# Patient Record
Sex: Male | Born: 1960 | Race: White | Hispanic: No | Marital: Single | Smoking: Former smoker
Health system: Southern US, Community
[De-identification: ages and names within clinical notes are randomized; demographics above are authoritative.]

## PROBLEM LIST (undated history)

## (undated) DIAGNOSIS — F329 Major depressive disorder, single episode, unspecified: Secondary | ICD-10-CM

## (undated) DIAGNOSIS — F419 Anxiety disorder, unspecified: Secondary | ICD-10-CM

## (undated) DIAGNOSIS — E039 Hypothyroidism, unspecified: Secondary | ICD-10-CM

## (undated) DIAGNOSIS — G5601 Carpal tunnel syndrome, right upper limb: Secondary | ICD-10-CM

## (undated) DIAGNOSIS — F32A Depression, unspecified: Secondary | ICD-10-CM

## (undated) HISTORY — DX: Anxiety disorder, unspecified: F41.9

## (undated) HISTORY — DX: Depression, unspecified: F32.A

## (undated) HISTORY — DX: Major depressive disorder, single episode, unspecified: F32.9

---

## 2012-12-03 ENCOUNTER — Other Ambulatory Visit: Payer: Self-pay | Admitting: Orthopedic Surgery

## 2012-12-10 ENCOUNTER — Encounter (HOSPITAL_COMMUNITY): Payer: Self-pay | Admitting: Pharmacy Technician

## 2012-12-12 ENCOUNTER — Other Ambulatory Visit (HOSPITAL_COMMUNITY): Payer: Self-pay | Admitting: Orthopedic Surgery

## 2012-12-12 NOTE — Patient Instructions (Addendum)
20 Savino Whisenant  12/12/2012   Your procedure is scheduled on: 12-19-2012  Report to [facility] at 0630 AM.  Call this number if you have problems the morning of surgery [phone]   Remember:   Do not eat food or drink liquids :After Midnight.     Take these medicines the morning of surgery with A SIP OF WATER: no medications   Do not wear jewelry, make-up or nail polish.  Do not wear lotions, powders, or perfumes. You may wear deodorant.   Men may shave face and neck.  Do not bring valuables to the hospital.  Contacts, dentures or bridgework may not be worn into surgery.  Leave suitcase in the car. After surgery it may be brought to your room.  For patients admitted to the hospital, checkout time is 11:00 AM the day of discharge.      Please read over the following fact sheets that you were given: MRSA Information.  Call  Birdie Sons RN pre op nurse if needed [phone]    FAILURE TO FOLLOW THESE INSTRUCTIONS MAY RESULT IN THE CANCELLATION OF YOUR SURGERY. PATIENT SIGNATURE___________________________________________

## 2012-12-13 ENCOUNTER — Encounter (HOSPITAL_COMMUNITY)
Admission: RE | Admit: 2012-12-13 | Discharge: 2012-12-13 | Disposition: A | Discharge status: Home / Self Care | Payer: Managed Care, Other (non HMO) | Source: Ambulatory Visit | Attending: Orthopedic Surgery | Admitting: Orthopedic Surgery

## 2012-12-13 ENCOUNTER — Encounter (HOSPITAL_COMMUNITY): Payer: Self-pay

## 2012-12-13 HISTORY — DX: Hypothyroidism, unspecified: E03.9

## 2012-12-13 LAB — CBC
HCT: 43.1 % (ref 39.0–52.0)
MCH: 31.7 pg (ref 26.0–34.0)
MCHC: 35 g/dL (ref 30.0–36.0)
MCV: 90.5 fL (ref 78.0–100.0)
Platelets: 159 10*3/uL (ref 150–400)
RDW: 12.4 % (ref 11.5–15.5)

## 2012-12-18 NOTE — Anesthesia Preprocedure Evaluation (Addendum)
Anesthesia Evaluation  Patient identified by MRN, date of birth, ID band Patient awake    Reviewed: Allergy & Precautions, H&P , NPO status , Patient's Chart, lab work & pertinent test results  Airway Mallampati: II TM Distance: >3 FB Neck ROM: Full    Dental  (+) Teeth Intact and Dental Advisory Given   Pulmonary neg pulmonary ROS,  breath sounds clear to auscultation  Pulmonary exam normal       Cardiovascular negative cardio ROS  Rhythm:Regular Rate:Normal     Neuro/Psych negative neurological ROS  negative psych ROS   GI/Hepatic negative GI ROS, Neg liver ROS,   Endo/Other  Hypothyroidism Morbid obesity  Renal/GU negative Renal ROS  negative genitourinary   Musculoskeletal negative musculoskeletal ROS (+)   Abdominal   Peds  Hematology negative hematology ROS (+)   Anesthesia Other Findings   Reproductive/Obstetrics negative OB ROS                          Anesthesia Physical Anesthesia Plan  ASA: II  Anesthesia Plan: General   Post-op Pain Management: MAC Combined w/ Regional for Post-op pain   Induction: Intravenous  Airway Management Planned: Oral ETT  Additional Equipment:   Intra-op Plan:   Post-operative Plan: Extubation in OR  Informed Consent: I have reviewed the patients History and Physical, chart, labs and discussed the procedure including the risks, benefits and alternatives for the proposed anesthesia with the patient or authorized representative who has indicated his/her understanding and acceptance.   Dental advisory given  Plan Discussed with: CRNA  Anesthesia Plan Comments:         Anesthesia Quick Evaluation

## 2012-12-19 ENCOUNTER — Ambulatory Visit (HOSPITAL_COMMUNITY): Payer: Managed Care, Other (non HMO) | Admitting: Anesthesiology

## 2012-12-19 ENCOUNTER — Encounter (HOSPITAL_COMMUNITY): Payer: Self-pay | Admitting: *Deleted

## 2012-12-19 ENCOUNTER — Ambulatory Visit (HOSPITAL_COMMUNITY)
Admission: RE | Admit: 2012-12-19 | Discharge: 2012-12-19 | Disposition: A | Discharge status: Home / Self Care | Payer: Managed Care, Other (non HMO) | Source: Ambulatory Visit | Attending: Orthopedic Surgery | Admitting: Orthopedic Surgery

## 2012-12-19 ENCOUNTER — Encounter (HOSPITAL_COMMUNITY)
Admission: RE | Disposition: A | Discharge status: Home / Self Care | Payer: Self-pay | Source: Ambulatory Visit | Attending: Orthopedic Surgery

## 2012-12-19 ENCOUNTER — Encounter (HOSPITAL_COMMUNITY): Payer: Self-pay | Admitting: Anesthesiology

## 2012-12-19 DIAGNOSIS — E039 Hypothyroidism, unspecified: Secondary | ICD-10-CM | POA: Insufficient documentation

## 2012-12-19 DIAGNOSIS — M25519 Pain in unspecified shoulder: Secondary | ICD-10-CM

## 2012-12-19 DIAGNOSIS — Z79899 Other long term (current) drug therapy: Secondary | ICD-10-CM | POA: Insufficient documentation

## 2012-12-19 DIAGNOSIS — M75100 Unspecified rotator cuff tear or rupture of unspecified shoulder, not specified as traumatic: Secondary | ICD-10-CM

## 2012-12-19 DIAGNOSIS — M24119 Other articular cartilage disorders, unspecified shoulder: Secondary | ICD-10-CM | POA: Insufficient documentation

## 2012-12-19 DIAGNOSIS — M719 Bursopathy, unspecified: Secondary | ICD-10-CM | POA: Insufficient documentation

## 2012-12-19 DIAGNOSIS — M67919 Unspecified disorder of synovium and tendon, unspecified shoulder: Secondary | ICD-10-CM | POA: Insufficient documentation

## 2012-12-19 DIAGNOSIS — M19019 Primary osteoarthritis, unspecified shoulder: Secondary | ICD-10-CM | POA: Insufficient documentation

## 2012-12-19 DIAGNOSIS — Z01812 Encounter for preprocedural laboratory examination: Secondary | ICD-10-CM | POA: Insufficient documentation

## 2012-12-19 HISTORY — PX: SHOULDER ARTHROSCOPY WITH OPEN ROTATOR CUFF REPAIR AND DISTAL CLAVICLE ACROMINECTOMY: SHX5683

## 2012-12-19 SURGERY — SHOULDER ARTHROSCOPY WITH OPEN ROTATOR CUFF REPAIR AND DISTAL CLAVICLE ACROMINECTOMY
Anesthesia: General | Site: Shoulder | Laterality: Left

## 2012-12-19 MED ORDER — FENTANYL CITRATE 0.05 MG/ML IJ SOLN
INTRAMUSCULAR | Status: DC | PRN
  Administered 2012-12-19 (×4): 50 ug via INTRAVENOUS

## 2012-12-19 MED ORDER — SUCCINYLCHOLINE CHLORIDE 20 MG/ML IJ SOLN
INTRAMUSCULAR | Status: DC | PRN
  Administered 2012-12-19: 120 mg via INTRAVENOUS

## 2012-12-19 MED ORDER — HYDROMORPHONE HCL PF 1 MG/ML IJ SOLN: 0.2500 mg | INTRAMUSCULAR | Status: DC | PRN

## 2012-12-19 MED ORDER — ONDANSETRON HCL 4 MG/2ML IJ SOLN
INTRAMUSCULAR | Status: DC | PRN
  Administered 2012-12-19 (×2): 2 mg via INTRAVENOUS

## 2012-12-19 MED ORDER — BUPIVACAINE-EPINEPHRINE 0.5% -1:200000 IJ SOLN
INTRAMUSCULAR | Status: DC | PRN
  Administered 2012-12-19: 13 mL

## 2012-12-19 MED ORDER — EPINEPHRINE HCL 1 MG/ML IJ SOLN
INTRAMUSCULAR | Status: DC | PRN
  Administered 2012-12-19 (×2): 1 mg

## 2012-12-19 MED ORDER — LACTATED RINGERS IR SOLN
Status: DC | PRN
  Administered 2012-12-19 (×2): 3000 mL

## 2012-12-19 MED ORDER — MIDAZOLAM HCL 5 MG/5ML IJ SOLN
INTRAMUSCULAR | Status: DC | PRN
  Administered 2012-12-19 (×2): 1 mg via INTRAVENOUS

## 2012-12-19 MED ORDER — ACETAMINOPHEN 10 MG/ML IV SOLN
INTRAVENOUS | Status: DC | PRN
  Administered 2012-12-19: 1000 mg via INTRAVENOUS

## 2012-12-19 MED ORDER — OXYCODONE-ACETAMINOPHEN 7.5-325 MG PO TABS: 1.0000 | ORAL_TABLET | ORAL | Status: DC | PRN

## 2012-12-19 MED ORDER — ROPIVACAINE HCL 5 MG/ML IJ SOLN
INTRAMUSCULAR | Status: DC | PRN
  Administered 2012-12-19: 30 mL via EPIDURAL

## 2012-12-19 MED ORDER — POVIDONE-IODINE 7.5 % EX SOLN: Freq: Once | CUTANEOUS | Status: DC

## 2012-12-19 MED ORDER — CISATRACURIUM BESYLATE (PF) 10 MG/5ML IV SOLN
INTRAVENOUS | Status: DC | PRN
  Administered 2012-12-19: 6 mg via INTRAVENOUS

## 2012-12-19 MED ORDER — LIDOCAINE HCL (CARDIAC) 20 MG/ML IV SOLN
INTRAVENOUS | Status: DC | PRN
  Administered 2012-12-19: 75 mg via INTRAVENOUS

## 2012-12-19 MED ORDER — LIDOCAINE HCL (CARDIAC) 20 MG/ML IV SOLN: INTRAVENOUS | Status: DC | PRN

## 2012-12-19 MED ORDER — SUCCINYLCHOLINE CHLORIDE 20 MG/ML IJ SOLN: INTRAMUSCULAR | Status: DC | PRN

## 2012-12-19 MED ORDER — PROMETHAZINE HCL 25 MG/ML IJ SOLN: 6.2500 mg | INTRAMUSCULAR | Status: DC | PRN

## 2012-12-19 MED ORDER — LACTATED RINGERS IV SOLN
INTRAVENOUS | Status: DC | PRN
  Administered 2012-12-19 (×2): via INTRAVENOUS

## 2012-12-19 MED ORDER — DEXAMETHASONE SODIUM PHOSPHATE 10 MG/ML IJ SOLN
INTRAMUSCULAR | Status: DC | PRN
  Administered 2012-12-19: 10 mg via INTRAVENOUS

## 2012-12-19 MED ORDER — CEFAZOLIN SODIUM-DEXTROSE 2-3 GM-% IV SOLR
2.0000 g | INTRAVENOUS | Status: AC
  Administered 2012-12-19: 2 g via INTRAVENOUS

## 2012-12-19 MED ORDER — PROPOFOL 10 MG/ML IV EMUL
INTRAVENOUS | Status: DC | PRN
  Administered 2012-12-19: 180 mg via INTRAVENOUS

## 2012-12-19 MED ORDER — LACTATED RINGERS IV SOLN
INTRAVENOUS | Status: DC
Start: 2012-12-19 — End: 2012-12-19

## 2012-12-19 MED ORDER — EPHEDRINE SULFATE 50 MG/ML IJ SOLN
INTRAMUSCULAR | Status: DC | PRN
  Administered 2012-12-19: 2.5 mg via INTRAVENOUS
  Administered 2012-12-19: 5 mg via INTRAVENOUS
  Administered 2012-12-19 (×2): 2.5 mg via INTRAVENOUS

## 2012-12-19 MED ORDER — METHOCARBAMOL 500 MG PO TABS: 500.0000 mg | ORAL_TABLET | Freq: Four times a day (QID) | ORAL | Status: DC

## 2012-12-19 SURGICAL SUPPLY — 50 items
ANCHOR PEEK ZIP 5.5 NDL NO2 (Orthopedic Implant) ×2 IMPLANT
BENZOIN TINCTURE PRP APPL 2/3 (GAUZE/BANDAGES/DRESSINGS) IMPLANT
BLADE 4.2CUDA (BLADE) ×2 IMPLANT
BLADE SURG SZ11 CARB STEEL (BLADE) ×2 IMPLANT
BOOTIES KNEE HIGH SLOAN (MISCELLANEOUS) IMPLANT
BUR OVAL 4.0 (BURR) ×2 IMPLANT
CLOTH BEACON ORANGE TIMEOUT ST (SAFETY) ×2 IMPLANT
COVER SURGICAL LIGHT HANDLE (MISCELLANEOUS) ×2 IMPLANT
DRAPE LG THREE QUARTER DISP (DRAPES) ×2 IMPLANT
DRAPE SHOULDER BEACH CHAIR (DRAPES) ×2 IMPLANT
DRAPE U-SHAPE 47X51 STRL (DRAPES) ×2 IMPLANT
DRSG ADAPTIC 3X8 NADH LF (GAUZE/BANDAGES/DRESSINGS) ×2 IMPLANT
DRSG PAD ABDOMINAL 8X10 ST (GAUZE/BANDAGES/DRESSINGS) ×4 IMPLANT
DURAPREP 26ML APPLICATOR (WOUND CARE) ×2 IMPLANT
ELECT KIT MENISCUS BASIC 165 (SET/KITS/TRAYS/PACK) IMPLANT
ELECT REM PT RETURN 9FT ADLT (ELECTROSURGICAL) ×2
ELECTRODE REM PT RTRN 9FT ADLT (ELECTROSURGICAL) ×1 IMPLANT
GLOVE BIOGEL PI IND STRL 7.0 (GLOVE) ×1 IMPLANT
GLOVE BIOGEL PI IND STRL 8 (GLOVE) ×1 IMPLANT
GLOVE BIOGEL PI INDICATOR 7.0 (GLOVE) ×1
GLOVE BIOGEL PI INDICATOR 8 (GLOVE) ×1
GLOVE ECLIPSE 8.0 STRL XLNG CF (GLOVE) ×2 IMPLANT
GLOVE INDICATOR 8.0 STRL GRN (GLOVE) ×4 IMPLANT
GOWN STRL REIN XL XLG (GOWN DISPOSABLE) ×6 IMPLANT
MANIFOLD NEPTUNE II (INSTRUMENTS) ×2 IMPLANT
NDL SAFETY ECLIPSE 18X1.5 (NEEDLE) ×1 IMPLANT
NEEDLE HYPO 18GX1.5 SHARP (NEEDLE) ×1
NEEDLE MA TROC 1/2 (NEEDLE) IMPLANT
NEEDLE MA TROC 1/2 CIR (NEEDLE) IMPLANT
NEEDLE SPNL 18GX3.5 QUINCKE PK (NEEDLE) ×2 IMPLANT
PACK SHOULDER CUSTOM OPM052 (CUSTOM PROCEDURE TRAY) ×2 IMPLANT
POSITIONER SURGICAL ARM (MISCELLANEOUS) IMPLANT
SET ARTHROSCOPY TUBING (MISCELLANEOUS) ×1
SET ARTHROSCOPY TUBING LN (MISCELLANEOUS) ×1 IMPLANT
SLING ARM IMMOBILIZER LRG (SOFTGOODS) ×2 IMPLANT
SLING ARM IMMOBILIZER MED (SOFTGOODS) IMPLANT
SPONGE GAUZE 4X4 12PLY (GAUZE/BANDAGES/DRESSINGS) ×2 IMPLANT
STAPLER VISISTAT (STAPLE) ×2 IMPLANT
STRIP CLOSURE SKIN 1/2X4 (GAUZE/BANDAGES/DRESSINGS) IMPLANT
SUCTION FRAZIER TIP 10 FR DISP (SUCTIONS) ×2 IMPLANT
SUT BONE WAX W31G (SUTURE) ×2 IMPLANT
SUT ETHIBOND NAB CT1 #1 30IN (SUTURE) IMPLANT
SUT ETHILON 4 0 PS 2 18 (SUTURE) ×2 IMPLANT
SUT VIC AB 1 CT1 27 (SUTURE) ×3
SUT VIC AB 1 CT1 27XBRD ANTBC (SUTURE) ×3 IMPLANT
SUT VIC AB 2-0 CT1 27 (SUTURE)
SUT VIC AB 2-0 CT1 27XBRD (SUTURE) IMPLANT
SYR 3ML LL SCALE MARK (SYRINGE) IMPLANT
TUBING CONNECTING 10 (TUBING) ×2 IMPLANT
WAND 90 DEG TURBOVAC W/CORD (SURGICAL WAND) ×2 IMPLANT

## 2012-12-19 NOTE — Transfer of Care (Signed)
Immediate Anesthesia Transfer of Care Note  Patient: Marc Sanders  Procedure(s) Performed: Procedure(s) (LRB) with comments: SHOULDER ARTHROSCOPY WITH OPEN ROTATOR CUFF REPAIR AND DISTAL CLAVICLE ACROMINECTOMY (Left) - LEFT SHOULDER ARTHROSCOPY WITH LABRAL DEBRIDEMENT AND SUBACROMIAL DECOMPRESSION DISTAL CLAVICLE RESECTION AND MINI OPEN ROTATOR CUFF REPAIR   Patient Location: PACU  Anesthesia Type:GA combined with regional for post-op pain  Level of Consciousness: awake, alert , oriented and patient cooperative  Airway & Oxygen Therapy: Patient Spontanous Breathing and Patient connected to face mask oxygen  Post-op Assessment: Report given to PACU RN, Post -op Vital signs reviewed and stable and Patient moving all extremities  Post vital signs: Reviewed and stable  Complications: No apparent anesthesia complications

## 2012-12-19 NOTE — H&P (Signed)
Marc Sanders is an 52 y.o. male.   Chief Complaint: painful lt shoulder HPI: MRI demonstrates labral tear, ac arthritis, and small full thickness rotator cuff tear  Past Medical History  Diagnosis Date  . Hypothyroidism     Past Surgical History  Procedure Date  . No past surgeries     History reviewed. No pertinent family history. Social History:  reports that he quit smoking about 26 years ago. His smoking use included Cigarettes. He has a 8 pack-year smoking history. He has never used smokeless tobacco. He reports that he drinks alcohol. He reports that he does not use illicit drugs.  Allergies: No Known Allergies  Medications Prior to Admission  Medication Sig Dispense Refill  . ibuprofen (ADVIL,MOTRIN) 200 MG tablet Take 400 mg by mouth 2 (two) times daily.      Marland Kitchen thyroid (ARMOUR) 15 MG tablet Take 15-30 mg by mouth 2 (two) times daily. 30mg  in the morning and 15mg  at bedtime.      . traMADol (ULTRAM) 50 MG tablet Take 50 mg by mouth every 6 (six) hours as needed.      . zolpidem (AMBIEN) 10 MG tablet Take 10 mg by mouth at bedtime.        No results found for this or any previous visit (from the past 48 hour(s)). No results found.  ROS  Blood pressure 144/92, pulse 82, temperature 97.1 F (36.2 C), temperature source Oral, resp. rate 18, SpO2 98.00%. Physical Exam  Constitutional: He is oriented to person, place, and time. He appears well-developed and well-nourished.  HENT:  Head: Normocephalic and atraumatic.  Right Ear: External ear normal.  Left Ear: External ear normal.  Nose: Nose normal.  Mouth/Throat: Oropharynx is clear and moist.  Eyes: Conjunctivae normal and EOM are normal. Pupils are equal, round, and reactive to light.  Neck: Normal range of motion. Neck supple.  Cardiovascular: Normal rate, regular rhythm, normal heart sounds and intact distal pulses.   Respiratory: Effort normal and breath sounds normal.  GI: Soft. Bowel sounds are normal.    Musculoskeletal: Normal range of motion. He exhibits tenderness.       Tender rotator cuff lt shoulder  Neurological: He is alert and oriented to person, place, and time. He has normal reflexes.  Skin: Skin is warm and dry.  Psychiatric: He has a normal mood and affect. His behavior is normal. Judgment and thought content normal.     Assessment/Plan Left shoulder--rotator cuff tear, labral tear, ac arthritis Left shoulder arthroscopy with labral debridement, SAD, DCD. Open rotator cuff repair  Kamree Wiens P 12/19/2012, 8:23 AM

## 2012-12-19 NOTE — Progress Notes (Addendum)
Pt left hand index, thumb, and middle finger are mobile and warm to touch. He states they are tingling but no sensation.  1320   No change in left hand. Patient instructed to keep sling on until 12/20/12 so that he does not try to use left shoulder.

## 2012-12-19 NOTE — Anesthesia Postprocedure Evaluation (Signed)
Anesthesia Post Note  Patient: Marc Sanders  Procedure(s) Performed: Procedure(s) (LRB): SHOULDER ARTHROSCOPY WITH OPEN ROTATOR CUFF REPAIR AND DISTAL CLAVICLE ACROMINECTOMY (Left)  Anesthesia type: General  Patient location: PACU  Post pain: Pain level controlled  Post assessment: Post-op Vital signs reviewed  Last Vitals:  Filed Vitals:   12/19/12 1115  BP: 167/95  Pulse: 93  Temp:   Resp: 18    Post vital signs: Reviewed  Level of consciousness: sedated  Complications: No apparent anesthesia complications

## 2012-12-19 NOTE — Progress Notes (Signed)
Awake, walking in hall.  Legs feel better. Still has foley, will DC and check later if voiding on own and PO meds controlling pain.

## 2012-12-19 NOTE — Anesthesia Procedure Notes (Signed)
Anesthesia Regional Block:  Interscalene brachial plexus block  Pre-Anesthetic Checklist: ,, timeout performed, Correct Patient, Correct Site, Correct Laterality, Correct Procedure, Correct Position, site marked, Risks and benefits discussed,  Surgical consent,  Pre-op evaluation,  At surgeon's request and post-op pain management   Prep: chloraprep       Needles:  Injection technique: Single-shot  Needle Type: Stimiplex          Additional Needles:  Procedures: ultrasound guided (picture in chart)  Motor weakness within 10 minutes. Interscalene brachial plexus block Narrative:  Start time: 12/19/2012 8:20 AM End time: 12/19/2012 8:25 AM Injection made incrementally with aspirations every 5 mL.  Performed by: Personally  Anesthesiologist: Lucille Passy MD  Additional Notes: Risks, benefits and alternative to block explained extensively.  Patient tolerated procedure well, without complications.  Supraclavicular block

## 2012-12-19 NOTE — Op Note (Signed)
NAME:  Marc Sanders, Marc Sanders NO.:  [id number]  MEDICAL RECORD NO.:  [id number]  LOCATION:  [facility]                         FACILITY:  [facility]  PHYSICIAN:  Marlowe Kays, M.D.  DATE OF BIRTH:  February 07, 1961  DATE OF PROCEDURE:  12/19/2012 DATE OF DISCHARGE:  12/19/2012                              OPERATIVE REPORT   PREOPERATIVE DIAGNOSES: 1. Full-thickness rotator cuff tear. 2. Labral tear. 3. Acromioclavicular joint arthritis, left shoulder.  POSTOPERATIVE DIAGNOSES: 1. Full-thickness rotator cuff tear. 2. Labral tear. 3. Acromioclavicular joint arthritis, left shoulder.  OPERATION: 1. Left shoulder arthroscopy with debridement of labrum and minimal     debridement of the undersurface of the rotator cuff. 2. Arthroscopic subacromial decompression. 3. Arthroscopic distal clavicle decompression. 4. Mini open repair of rotator cuff tear.  SURGEON:  Marlowe Kays, MD  ASSISTANT:  Mr. Idolina Primer, [place]  ANESTHESIA:  General anesthesia preceded by interscalene block.  JUSTIFICATION FOR PROCEDURE:  Painful left shoulder with an MRI demonstrating the preoperative diagnoses which were confirmed at surgery.  The tear was small enough, felt it could be managed with a mini open incision.  He had impingement on the rotator cuff by the distal clavicle, but the [facility] joint itself looked unremarkable and was nontender there.  PROCEDURE IN DETAIL:  Satisfactory interscalene block by Anesthesia, satisfied general anesthesia, beach chair position on the [place] frame, left shoulder girdle was prepped with DuraPrep and draped in sterile field.  Time-out performed.  Prophylactic antibiotics used.  Mr. Angie Fava assistance was necessary because of the complexity operation for holding the arm, retraction, and assistance with instrumentation. After time-out was performed, I marked out the anatomy of the shoulder and infiltrated the posterior soft spot portal site, lateral portal site,  and subacromial space with Marcaine with adrenaline for hemostatic purposes.  Through a posterior soft spot portal, atraumatically entered the glenohumeral joint, and found significant disruption of the labrum and some fraying of the rotator cuff.  Accordingly, I advanced the scope between the biceps tendon and subscapularis using a switching stick, made an anterior incision over the switching stick.  I then placed a metal cannula over the switching stick, and entered into the joint followed by 4.2 shaver, shaving down the biceps tendon and debriding the underneath surface of rotator cuff.  Final pictures were taken and I then directed the scope in subacromial space and through a lateral portal, introduced a 4.2 shaver and a good bit of bursitis present, which I resected with the 4.2 shaver.  I then followed this with the 90 degree ArthroCare vaporizer removing soft tissue from the undersurface of the acromion and the AC joint.  I then followed this with a 4 mm oval bur, burring down the undersurface of the acromion and the distal clavicle.  I went back and forth between the vaporizer and the bur until we had wide decompression.  The rotator cuff tear was identified.  I then removed fluid from the subacromial space and placed 4-0 nylon mattress sutures in the 3 portal sites.  We then made a 7 cm midline vertical incision over the area of the tear of the rotator cuff.  I split the deltoid at this point, and  I was able to locate the tear which measured about 2 cm vertically and 7 mm to 1 cm lateral ward.  After roughening up the articular cartilage over the greater tuberosity, I placed a 5.5, a 4 strand Stryker anchor and then brought the rotator cuff flap back to its anatomic position.  I then over sewed the terminal and with lateral tissue.  This gave a nice stable repair.  The pictures of the tear and the repair were taken.  The wound was then irrigated with sterile saline.  The small  incision in the deltoid and the fascia over a small area of the anterior acromion was repaired with interrupted #1 Vicryl and subcutaneous tissue 2-0 Vicryl, staples in the skin. Betadine, Adaptic, dry sterile dressing were applied, was placed in shoulder immobilizer and taken to recovery room in satisfactory condition with no known complications.          ______________________________ Marlowe Kays, M.D.     JA/MEDQ  D:  12/19/2012  T:  12/19/2012  Job:  [id number]

## 2012-12-19 NOTE — Brief Op Note (Signed)
12/19/2012  10:53 AM  PATIENT:  Marc Sanders  52 y.o. male  PRE-OPERATIVE DIAGNOSIS:  left shoulder labral tear OA and rotator cuff tear   POST-OPERATIVE DIAGNOSIS:  left shoulder labral tear OA and rotator cuff tear   PROCEDURE:  Procedure(s) (LRB) with comments: SHOULDER ARTHROSCOPY WITH OPEN ROTATOR CUFF REPAIR AND DISTAL CLAVICLE ACROMINECTOMY (Left) - LEFT SHOULDER ARTHROSCOPY WITH LABRAL DEBRIDEMENT AND SUBACROMIAL DECOMPRESSION DISTAL CLAVICLE RESECTION AND MINI OPEN ROTATOR CUFF REPAIR   SURGEON:  Surgeon(s) and Role:    * Drucilla Schmidt, MD - Primary  PHYSICIAN ASSISTANT:   ASSISTANTS:mr Underwood PAC2}   ANESTHESIA:   regional and general  EBL:  Total I/O In: 1000 [I.V.:1000] Out: -   BLOOD ADMINISTERED:none  DRAINS: none   LOCAL MEDICATIONS USED:  MARCAINE     SPECIMEN:  No Specimen  DISPOSITION OF SPECIMEN:  N/A  COUNTS:  YES  TOURNIQUET:  * No tourniquets in log *  DICTATION: .Other Dictation: Dictation Number [id number]  PLAN OF CARE: Discharge to home after PACU  PATIENT DISPOSITION:  PACU - hemodynamically stable.   Delay start of Pharmacological VTE agent (>24hrs) due to surgical blood loss or risk of bleeding: yes

## 2012-12-19 NOTE — Preoperative (Signed)
Beta Blockers   Reason not to administer Beta Blockers:Not Applicable 

## 2012-12-20 ENCOUNTER — Encounter (HOSPITAL_COMMUNITY): Payer: Self-pay | Admitting: Orthopedic Surgery

## 2013-02-13 ENCOUNTER — Other Ambulatory Visit: Payer: Self-pay | Admitting: Orthopedic Surgery

## 2013-02-25 ENCOUNTER — Encounter (HOSPITAL_BASED_OUTPATIENT_CLINIC_OR_DEPARTMENT_OTHER): Payer: Self-pay | Admitting: *Deleted

## 2013-02-26 ENCOUNTER — Encounter (HOSPITAL_BASED_OUTPATIENT_CLINIC_OR_DEPARTMENT_OTHER): Payer: Self-pay | Admitting: *Deleted

## 2013-02-26 NOTE — Progress Notes (Signed)
NPO AFTER MN. ARRIVES AT 1100. NEEDS HG. WILL TAKE ARMOUR AM OF SURG W/ SIP OF WATER.

## 2013-03-01 ENCOUNTER — Encounter (HOSPITAL_BASED_OUTPATIENT_CLINIC_OR_DEPARTMENT_OTHER): Payer: Self-pay | Admitting: *Deleted

## 2013-03-01 ENCOUNTER — Ambulatory Visit (HOSPITAL_BASED_OUTPATIENT_CLINIC_OR_DEPARTMENT_OTHER)
Admission: RE | Admit: 2013-03-01 | Discharge: 2013-03-01 | Disposition: A | Discharge status: Home / Self Care | Payer: Managed Care, Other (non HMO) | Source: Ambulatory Visit | Attending: Orthopedic Surgery | Admitting: Orthopedic Surgery

## 2013-03-01 ENCOUNTER — Ambulatory Visit (HOSPITAL_BASED_OUTPATIENT_CLINIC_OR_DEPARTMENT_OTHER): Payer: Managed Care, Other (non HMO) | Admitting: Anesthesiology

## 2013-03-01 ENCOUNTER — Encounter (HOSPITAL_BASED_OUTPATIENT_CLINIC_OR_DEPARTMENT_OTHER)
Admission: RE | Disposition: A | Discharge status: Home / Self Care | Payer: Self-pay | Source: Ambulatory Visit | Attending: Orthopedic Surgery

## 2013-03-01 ENCOUNTER — Encounter (HOSPITAL_BASED_OUTPATIENT_CLINIC_OR_DEPARTMENT_OTHER): Payer: Self-pay | Admitting: Anesthesiology

## 2013-03-01 DIAGNOSIS — G5600 Carpal tunnel syndrome, unspecified upper limb: Secondary | ICD-10-CM | POA: Insufficient documentation

## 2013-03-01 DIAGNOSIS — G5601 Carpal tunnel syndrome, right upper limb: Secondary | ICD-10-CM

## 2013-03-01 DIAGNOSIS — Z79899 Other long term (current) drug therapy: Secondary | ICD-10-CM | POA: Insufficient documentation

## 2013-03-01 DIAGNOSIS — E039 Hypothyroidism, unspecified: Secondary | ICD-10-CM | POA: Insufficient documentation

## 2013-03-01 HISTORY — DX: Carpal tunnel syndrome, right upper limb: G56.01

## 2013-03-01 HISTORY — PX: CARPAL TUNNEL RELEASE: SHX101

## 2013-03-01 SURGERY — CARPAL TUNNEL RELEASE
Anesthesia: Regional | Site: Wrist | Laterality: Right | Wound class: Clean Contaminated

## 2013-03-01 MED ORDER — HYDROMORPHONE HCL PF 1 MG/ML IJ SOLN
0.2500 mg | INTRAMUSCULAR | Status: DC | PRN
  Filled 2013-03-01: qty 1

## 2013-03-01 MED ORDER — MIDAZOLAM HCL 5 MG/5ML IJ SOLN
INTRAMUSCULAR | Status: DC | PRN
  Administered 2013-03-01: 2 mg via INTRAVENOUS

## 2013-03-01 MED ORDER — PROPOFOL 10 MG/ML IV EMUL
INTRAVENOUS | Status: DC | PRN
  Administered 2013-03-01: 50 ug/kg/min via INTRAVENOUS

## 2013-03-01 MED ORDER — LIDOCAINE HCL (PF) 0.5 % IJ SOLN
INTRAMUSCULAR | Status: DC | PRN
  Administered 2013-03-01: 50 mL via INTRAVENOUS

## 2013-03-01 MED ORDER — ONDANSETRON HCL 4 MG/2ML IJ SOLN
INTRAMUSCULAR | Status: DC | PRN
  Administered 2013-03-01: 4 mg via INTRAVENOUS

## 2013-03-01 MED ORDER — KETOROLAC TROMETHAMINE 10 MG PO TABS
10.0000 mg | ORAL_TABLET | Freq: Once | ORAL | Status: AC
  Administered 2013-03-01: 10 mg via ORAL
  Filled 2013-03-01: qty 1

## 2013-03-01 MED ORDER — PROMETHAZINE HCL 25 MG/ML IJ SOLN
6.2500 mg | INTRAMUSCULAR | Status: DC | PRN
  Filled 2013-03-01: qty 1

## 2013-03-01 MED ORDER — ACETAMINOPHEN 10 MG/ML IV SOLN
INTRAVENOUS | Status: DC | PRN
  Administered 2013-03-01: 1000 mg via INTRAVENOUS

## 2013-03-01 MED ORDER — KETOROLAC TROMETHAMINE 10 MG PO TABS: 10.0000 mg | ORAL_TABLET | Freq: Four times a day (QID) | ORAL | Status: DC | PRN

## 2013-03-01 MED ORDER — LACTATED RINGERS IV SOLN
INTRAVENOUS | Status: DC
  Administered 2013-03-01: 100 mL/h via INTRAVENOUS
  Filled 2013-03-01: qty 1000

## 2013-03-01 MED ORDER — LACTATED RINGERS IV SOLN
INTRAVENOUS | Status: DC
  Filled 2013-03-01: qty 1000

## 2013-03-01 MED ORDER — DEXAMETHASONE SODIUM PHOSPHATE 10 MG/ML IJ SOLN
INTRAMUSCULAR | Status: DC | PRN
  Administered 2013-03-01: 10 mg via INTRAVENOUS

## 2013-03-01 MED ORDER — POVIDONE-IODINE 7.5 % EX SOLN
Freq: Once | CUTANEOUS | Status: DC
  Filled 2013-03-01: qty 118

## 2013-03-01 MED ORDER — FENTANYL CITRATE 0.05 MG/ML IJ SOLN
INTRAMUSCULAR | Status: DC | PRN
  Administered 2013-03-01 (×2): 100 ug via INTRAVENOUS

## 2013-03-01 SURGICAL SUPPLY — 49 items
BANDAGE CONFORM 3  STR LF (GAUZE/BANDAGES/DRESSINGS) ×2 IMPLANT
BANDAGE ELASTIC 3 VELCRO ST LF (GAUZE/BANDAGES/DRESSINGS) IMPLANT
BANDAGE ELASTIC 4 VELCRO ST LF (GAUZE/BANDAGES/DRESSINGS) ×2 IMPLANT
BLADE SURG 15 STRL LF DISP TIS (BLADE) ×1 IMPLANT
BLADE SURG 15 STRL SS (BLADE) ×1
BNDG ESMARK 4X9 LF (GAUZE/BANDAGES/DRESSINGS) ×2 IMPLANT
CLOTH BEACON ORANGE TIMEOUT ST (SAFETY) ×2 IMPLANT
CORDS BIPOLAR (ELECTRODE) ×2 IMPLANT
COVER TABLE BACK 60X90 (DRAPES) ×2 IMPLANT
DRAPE EXTREMITY T 121X128X90 (DRAPE) ×2 IMPLANT
DRAPE LG THREE QUARTER DISP (DRAPES) ×4 IMPLANT
DRAPE U-SHAPE 47X51 STRL (DRAPES) ×2 IMPLANT
DRSG EMULSION OIL 3X3 NADH (GAUZE/BANDAGES/DRESSINGS) ×2 IMPLANT
DURAPREP 26ML APPLICATOR (WOUND CARE) ×2 IMPLANT
ELECT REM PT RETURN 9FT ADLT (ELECTROSURGICAL)
ELECTRODE REM PT RTRN 9FT ADLT (ELECTROSURGICAL) IMPLANT
GLOVE BIO SURGEON STRL SZ7.5 (GLOVE) ×2 IMPLANT
GLOVE BIOGEL PI IND STRL 6.5 (GLOVE) ×2 IMPLANT
GLOVE BIOGEL PI IND STRL 8 (GLOVE) IMPLANT
GLOVE BIOGEL PI INDICATOR 6.5 (GLOVE) ×2
GLOVE BIOGEL PI INDICATOR 8 (GLOVE)
GLOVE ECLIPSE 8.0 STRL XLNG CF (GLOVE) ×2 IMPLANT
GLOVE INDICATOR 6.5 STRL GRN (GLOVE) ×4 IMPLANT
GOWN PREVENTION PLUS LG XLONG (DISPOSABLE) ×4 IMPLANT
GOWN STRL REIN XL XLG (GOWN DISPOSABLE) ×2 IMPLANT
MARKER SKIN DUAL TIP RULER LAB (MISCELLANEOUS) ×2 IMPLANT
NEEDLE 27GAX1X1/2 (NEEDLE) IMPLANT
NEEDLE HYPO 22GX1.5 SAFETY (NEEDLE) IMPLANT
NS IRRIG 500ML POUR BTL (IV SOLUTION) ×2 IMPLANT
PACK BASIN DAY SURGERY FS (CUSTOM PROCEDURE TRAY) ×2 IMPLANT
PAD CAST 3X4 CTTN HI CHSV (CAST SUPPLIES) IMPLANT
PAD CAST 4YDX4 CTTN HI CHSV (CAST SUPPLIES) ×1 IMPLANT
PADDING CAST ABS 4INX4YD NS (CAST SUPPLIES) ×1
PADDING CAST ABS COTTON 4X4 ST (CAST SUPPLIES) ×1 IMPLANT
PADDING CAST COTTON 3X4 STRL (CAST SUPPLIES)
PADDING CAST COTTON 4X4 STRL (CAST SUPPLIES) ×1
PENCIL BUTTON HOLSTER BLD 10FT (ELECTRODE) IMPLANT
SPLINT PLASTER CAST XFAST 3X15 (CAST SUPPLIES) IMPLANT
SPLINT PLASTER CAST XFAST 4X15 (CAST SUPPLIES) ×10 IMPLANT
SPLINT PLASTER XTRA FAST SET 4 (CAST SUPPLIES) ×10
SPLINT PLASTER XTRA FASTSET 3X (CAST SUPPLIES)
SPONGE GAUZE 4X4 12PLY (GAUZE/BANDAGES/DRESSINGS) ×2 IMPLANT
STOCKINETTE 4X48 STRL (DRAPES) ×2 IMPLANT
SUT ETHILON 4 0 PS 2 18 (SUTURE) ×6 IMPLANT
SYR BULB 3OZ (MISCELLANEOUS) ×2 IMPLANT
SYR CONTROL 10ML LL (SYRINGE) IMPLANT
TOWEL OR 17X24 6PK STRL BLUE (TOWEL DISPOSABLE) ×2 IMPLANT
UNDERPAD 30X30 INCONTINENT (UNDERPADS AND DIAPERS) ×2 IMPLANT
WATER STERILE IRR 500ML POUR (IV SOLUTION) IMPLANT

## 2013-03-01 NOTE — Transfer of Care (Signed)
Immediate Anesthesia Transfer of Care Note  Patient: Marc Sanders  Procedure(s) Performed: Procedure(s): CARPAL TUNNEL RELEASE (Right)  Patient Location: PACU  Anesthesia Type:Bier block  Level of Consciousness: awake, alert  and oriented  Airway & Oxygen Therapy: Patient Spontanous Breathing  Post-op Assessment: Report given to PACU RN  Post vital signs: Reviewed and stable  Complications: No apparent anesthesia complications

## 2013-03-01 NOTE — Anesthesia Preprocedure Evaluation (Addendum)
Anesthesia Evaluation  Patient identified by MRN, date of birth, ID band Patient awake    Reviewed: Allergy & Precautions, H&P , NPO status , Patient's Chart, lab work & pertinent test results  Airway Mallampati: II TM Distance: >3 FB Neck ROM: Full    Dental  (+) Teeth Intact and Dental Advisory Given   Pulmonary neg pulmonary ROS, former smoker,  breath sounds clear to auscultation  Pulmonary exam normal       Cardiovascular negative cardio ROS  Rhythm:Regular Rate:Normal     Neuro/Psych negative neurological ROS  negative psych ROS   GI/Hepatic negative GI ROS, Neg liver ROS,   Endo/Other  Hypothyroidism Morbid obesity  Renal/GU negative Renal ROS  negative genitourinary   Musculoskeletal negative musculoskeletal ROS (+)   Abdominal   Peds  Hematology negative hematology ROS (+)   Anesthesia Other Findings   Reproductive/Obstetrics                           Anesthesia Physical Anesthesia Plan  ASA: II  Anesthesia Plan: Bier Block   Post-op Pain Management:    Induction: Intravenous  Airway Management Planned: Simple Face Mask  Additional Equipment:   Intra-op Plan:   Post-operative Plan: Extubation in OR  Informed Consent: I have reviewed the patients History and Physical, chart, labs and discussed the procedure including the risks, benefits and alternatives for the proposed anesthesia with the patient or authorized representative who has indicated his/her understanding and acceptance.   Dental advisory given  Plan Discussed with: CRNA  Anesthesia Plan Comments:         Anesthesia Quick Evaluation

## 2013-03-01 NOTE — Anesthesia Procedure Notes (Signed)
Anesthesia Regional Block:  Bier block (IV Regional)  Pre-Anesthetic Checklist: ,, timeout performed, Correct Patient, Correct Site, Correct Laterality, Correct Procedure,, site marked, surgical consent,, at surgeon's request Needles:  Injection technique: Single-shot  Needle Type: Other      Needle Gauge: 22 and 22 G    Additional Needles: Bier block (IV Regional) Narrative:   Performed by: Personally   Bier block (IV Regional)

## 2013-03-01 NOTE — H&P (Signed)
Marc Sanders is an 52 y.o. male.   Chief Complaint: pain and numbness rt hand [id number] median nerve conductions  On testing  Past Medical History  Diagnosis Date  . Hypothyroidism   . Carpal tunnel syndrome of right wrist     Past Surgical History  Procedure Laterality Date  . Shoulder arthroscopy with open rotator cuff repair and distal clavicle acrominectomy  12/19/2012    Procedure: SHOULDER ARTHROSCOPY WITH OPEN ROTATOR CUFF REPAIR AND DISTAL CLAVICLE ACROMINECTOMY;  Surgeon: Drucilla Schmidt, MD;  Location: [facility];  Service: Orthopedics;  Laterality: Left;  LEFT SHOULDER ARTHROSCOPY WITH LABRAL DEBRIDEMENT AND SUBACROMIAL DECOMPRESSION DISTAL CLAVICLE RESECTION AND MINI OPEN ROTATOR CUFF REPAIR WITH ANCHOR    History reviewed. No pertinent family history. Social History:  reports that he quit smoking about 26 years ago. His smoking use included Cigarettes. He has a 8 pack-year smoking history. He has never used smokeless tobacco. He reports that he drinks about 4.2 ounces of alcohol per week. He reports that he does not use illicit drugs.  Allergies: No Known Allergies  Medications Prior to Admission  Medication Sig Dispense Refill  . Nutritional Supplements (SALMON OIL) CAPS Take 2 capsules by mouth daily.      . Red Yeast Rice Extract (RED YEAST RICE PO) Take 1 capsule by mouth 2 (two) times daily.      Marland Kitchen thyroid (ARMOUR) 15 MG tablet Take 15-30 mg by mouth 2 (two) times daily. 30mg  in the morning and 15mg  at bedtime.      . TURMERIC PO Take 1 tablet by mouth 2 (two) times daily.      Marland Kitchen zolpidem (AMBIEN) 10 MG tablet Take 10 mg by mouth at bedtime.        Results for orders placed during the hospital encounter of 03/01/13 (from the past 48 hour(s))  POCT HEMOGLOBIN-HEMACUE     Status: None   Collection Time    03/01/13 11:23 AM      Result Value Range   Hemoglobin 14.7  13.0 - 17.0 g/dL   No results found.  ROS  Blood pressure 148/96, pulse 60, temperature 97.6  F (36.4 C), temperature source Oral, resp. rate 16, height 6\' 2"  (1.88 m), weight 107.956 kg (238 lb), SpO2 100.00%. Physical Exam  Constitutional: He is oriented to person, place, and time. He appears well-developed and well-nourished.  HENT:  Head: Normocephalic and atraumatic.  Right Ear: External ear normal.  Left Ear: External ear normal.  Nose: Nose normal.  Mouth/Throat: Oropharynx is clear and moist.  Eyes: Conjunctivae and EOM are normal. Pupils are equal, round, and reactive to light.  Neck: Normal range of motion. Neck supple.  Cardiovascular: Normal rate, regular rhythm, normal heart sounds and intact distal pulses.   Respiratory: Effort normal and breath sounds normal.  GI: Soft. Bowel sounds are normal.  Musculoskeletal: Normal range of motion.  Decreased sensation median nerve digits rt hand  Neurological: He is alert and oriented to person, place, and time. He has normal reflexes.  Skin: Skin is warm and dry.  Psychiatric: He has a normal mood and affect. His behavior is normal. Judgment and thought content normal.     Assessment/Plan Carpal tunnel syndrome rt Decompression median nerve rt wrist and hand  APLINGTON,JAMES P 03/01/2013, 1:03 PM

## 2013-03-01 NOTE — Anesthesia Postprocedure Evaluation (Signed)
  Anesthesia Post-op Note  Patient: Marc Sanders  Procedure(s) Performed: Procedure(s) (LRB): CARPAL TUNNEL RELEASE (Right)  Patient Location: PACU  Anesthesia Type: Bier block  Level of Consciousness: awake and alert   Airway and Oxygen Therapy: Patient Spontanous Breathing  Post-op Pain: mild  Post-op Assessment: Post-op Vital signs reviewed, Patient's Cardiovascular Status Stable, Respiratory Function Stable, Patent Airway and No signs of Nausea or vomiting  Last Vitals:  Filed Vitals:   03/01/13 1415  BP: 116/74  Pulse: 71  Temp:   Resp: 12    Post-op Vital Signs: stable   Complications: No apparent anesthesia complications

## 2013-03-04 ENCOUNTER — Encounter (HOSPITAL_BASED_OUTPATIENT_CLINIC_OR_DEPARTMENT_OTHER): Payer: Self-pay | Admitting: Orthopedic Surgery

## 2013-03-04 NOTE — Op Note (Signed)
NAME:  Marc, Sanders NO.:  [id number]  MEDICAL RECORD NO.:  [id number]  LOCATION:                                 FACILITY:  PHYSICIAN:  Marlowe Kays, M.D.  DATE OF BIRTH:  1961/07/29  DATE OF PROCEDURE:  03/01/2013 DATE OF DISCHARGE:                              OPERATIVE REPORT   PREOPERATIVE DIAGNOSIS:  Carpal tunnel syndrome right upper extremity.  POSTOPERATIVE DIAGNOSIS:  Carpal tunnel syndrome right upper extremity.  OPERATION:  Decompression median nerve, right wrist and hand.  SURGEON:  Marlowe Kays, M.D.  ASSISTANT:  Nurse.  ANESTHESIA:  IV regional.  PATHOLOGY AND JUSTIFICATION FOR PROCEDURE:  Pain and numbness in the right hand with nerve conduction studies demonstrating significant compression of the median nerve.  PROCEDURE IN DETAIL:  Satisfactory IV regional anesthesia, DuraPrep from midforearm to fingertips was draped in sterile field.  Time-out performed.  I marked out a curved incision along the base of thenar eminence crossing obliquely over the flexor crease of the wrist and the distal forearm.  The palmaris longus tendon was identified and retracted to the ulnar side.  Beneath the median nerve was identified.  There was a tight band, fascial band proximal to the flexor crease of the wrist and proximal to this, the median nerve was quite bulbous.  Working distally, the palmaris longus tendon went all way into the palm.  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  In the carpal canal, the central artery to the median nerve was also terminated secondary to pressure.  I released skin and subcutaneous tissue and fascia into the distal palm.  Potential bleeders were coagulated with bipolar cautery. The wound was then irrigated with sterile saline.  Skin and subcutaneous tissue only closed with interrupted 4-0 nylon mattress sutures. Betadine, Adaptic, dry sterile dressing, and a volar plaster splint were applied.  Tourniquet  was released.  He tolerated the procedure well.  At the time of this dictation, he was transported to the recovery room in satisfactory condition with no known complications.          ______________________________ Marlowe Kays, M.D.     JA/MEDQ  D:  03/01/2013  T:  03/02/2013  Job:  [id number]

## 2013-03-05 NOTE — Addendum Note (Signed)
Addendum created 03/05/13 1306 by Fran Lowes, CRNA   Modules edited: Charges VN

## 2016-11-17 ENCOUNTER — Ambulatory Visit (INDEPENDENT_AMBULATORY_CARE_PROVIDER_SITE_OTHER): Payer: Managed Care, Other (non HMO) | Admitting: Family Medicine

## 2016-11-17 ENCOUNTER — Encounter: Payer: Self-pay | Admitting: Family Medicine

## 2016-11-17 VITALS — BP 122/80 | HR 71 | Resp 12 | Ht 74.0 in | Wt 258.0 lb

## 2016-11-17 DIAGNOSIS — Z6833 Body mass index (BMI) 33.0-33.9, adult: Secondary | ICD-10-CM | POA: Diagnosis not present

## 2016-11-17 DIAGNOSIS — E785 Hyperlipidemia, unspecified: Secondary | ICD-10-CM

## 2016-11-17 DIAGNOSIS — Z23 Encounter for immunization: Secondary | ICD-10-CM | POA: Diagnosis not present

## 2016-11-17 DIAGNOSIS — F339 Major depressive disorder, recurrent, unspecified: Secondary | ICD-10-CM

## 2016-11-17 DIAGNOSIS — F419 Anxiety disorder, unspecified: Secondary | ICD-10-CM | POA: Insufficient documentation

## 2016-11-17 DIAGNOSIS — G4700 Insomnia, unspecified: Secondary | ICD-10-CM

## 2016-11-17 MED ORDER — MELATONIN ER 5 MG PO TBCR: 5.0000 mg | EXTENDED_RELEASE_TABLET | Freq: Every day | ORAL | 1 refills | Status: DC

## 2016-11-17 MED ORDER — VENLAFAXINE HCL ER 75 MG PO CP24: 75.0000 mg | ORAL_CAPSULE | Freq: Every day | ORAL | 2 refills | Status: DC

## 2016-11-17 MED ORDER — ESZOPICLONE 3 MG PO TABS: 3.0000 mg | ORAL_TABLET | Freq: Every day | ORAL | 0 refills | Status: DC

## 2016-11-17 NOTE — Patient Instructions (Addendum)
A few things to remember from today's visit:   Insomnia, unspecified type - Plan: Melatonin ER 5 MG TBCR, Eszopiclone 3 MG TABS  Hyperlipidemia, unspecified hyperlipidemia type  BMI 33.0-33.9,adult  Episode of recurrent major depressive disorder, unspecified depression episode severity (HCC) - Plan: venlafaxine XR (EFFEXOR XR) 75 MG 24 hr capsule  Anxiety disorder, unspecified type    ? What can I do to sleep better?   Improving your sleep habits is a good start.  Medical or psychiatric conditions might be making your insomnia worse.  Medicine might help, but you shouldn't use sleeping pills long term.   Some people need more sleep than others.   Sleep usually occurs in two- to three-hour cycles, so it is important to get at least three uninterrupted hours of sleep.  The following tips can help you develop better sleep habits: Go to bed and wake up at the same time each day Lie down to sleep only when sleepy. If you can't sleep after 20 minutes, get out of bed and go to another room; return to the bedroom when you are tired; repeat as necessary. Use bedroom for sleep and sex only. Don't do things in bed that might keep you awake, like watching television, reading, talking on the phone, or worrying Avoid caffeine, nicotine, or alcohol for at least four to six hours beforebedtime\ls1Avoid strenuous exercise within four hours of bedtime. Avoid daytime napping. Relax before going to bed. Avoid eating large meals or drinking a lot of water or other liquids in the evening. Keep the bedroom a comfortable temperature. Use earplugs if noise is a problem. Expose yourself to daytime light for at least 30 minutes each morning  Lexapro decreased from 20 mg to 10 mg (1/2 tab). Effexor and Melatonin added.    Please be sure medication list is accurate. If a new problem present, please set up appointment sooner than planned today.

## 2016-11-17 NOTE — Progress Notes (Signed)
HPI:   Mr.Marc Madison HickmanConrad Sanders is a 55 y.o. male, who is here today to establish care with me.  Former PCP: Pomposini Last preventive routine visit: 02/2016.    Concerns today: Anxiety and stress  According to pt, he has had anxiety and depression "all my life", he is currently on Lexapro 20 mg daily, which he has taken for a year. Hx of psychiatric hospitalizations or ER visits: Denies Any hx of bipolar disorder: Denies Suicidal thoughts: Denies Insomnia: Yes, takes Lunesta, which sometimes it does not help. He took Ambien but did not help. Sleeps about 4 hours daily, main problem is staying asleep.  Tobacco use: Former smoker Alcohol abuse: Denies Hx of illicit drug ZOX:WRUEAVuse:Denies current use but reporting marijuana use in his late teens and early 20's. FHx for psychiatric disorders: Mother and 2 sisters.  + Panic attacks: palpitations. Her sister gave him one Clonazepam tab last night and seemed to help.  Medications in the past: Zoloft (diarrhea).   He lives with adopted child (54 years old). He has adopted other 2 children and already adults, he states that they are"not doing well."  He takes 40 oz of beer at night with Lunesta and sometimes helps him sleep better but he wakes up in the middle of the night and cannot go back to sleep.    HDL: He exercises 3-4 times per week, gym. He tries to follow a healthy diet. He is on Lipitor, has taken it for 3 years. Last FLP 02/2016.     Review of Systems  Constitutional: Positive for fatigue. Negative for activity change, appetite change, fever and unexpected weight change.  HENT: Negative for nosebleeds, sore throat and trouble swallowing.   Eyes: Negative for pain and visual disturbance.  Respiratory: Negative for cough, shortness of breath and wheezing.   Cardiovascular: Negative for chest pain, palpitations and leg swelling.  Gastrointestinal: Negative for abdominal pain, nausea and vomiting.  Endocrine:  Negative for cold intolerance and heat intolerance.  Genitourinary: Negative for decreased urine volume, difficulty urinating and hematuria.  Neurological: Negative for syncope, weakness and headaches.  Psychiatric/Behavioral: Positive for sleep disturbance. Negative for confusion. The patient is nervous/anxious.       No current outpatient prescriptions on file prior to visit.   No current facility-administered medications on file prior to visit.      Past Medical History:  Diagnosis Date  . Anxiety   . Carpal tunnel syndrome of right wrist   . Depression   . Hypothyroidism    No Known Allergies  Family History  Problem Relation Age of Onset  . Diabetes Mother   . Mental illness Mother   . Hypertension Mother   . Mental illness Father     Alzheimer's  . Hypertension Father   . Mental illness Sister     Social History   Social History  . Marital status: Single    Spouse name: N/A  . Number of children: N/A  . Years of education: N/A   Social History Main Topics  . Smoking status: Former Smoker    Packs/day: 1.00    Years: 8.00    Types: Cigarettes    Quit date: 12/05/1986  . Smokeless tobacco: Never Used  . Alcohol use 4.2 oz/week    7 Glasses of wine per week  . Drug use: No  . Sexual activity: Yes   Other Topics Concern  . None   Social History Narrative  . None  Vitals:   11/17/16 0752  BP: 122/80  Pulse: 71  Resp: 12    Body mass index is 33.13 kg/m.    Physical Exam  Nursing note and vitals reviewed. Constitutional: He is oriented to person, place, and time. He appears well-developed. No distress.  HENT:  Head: Atraumatic.  Mouth/Throat: Oropharynx is clear and moist and mucous membranes are normal.  Eyes: Conjunctivae and EOM are normal. Pupils are equal, round, and reactive to light.  Cardiovascular: Normal rate and regular rhythm.   No murmur heard. Pulses:      Dorsalis pedis pulses are 2+ on the right side, and 2+ on the  left side.  Respiratory: Effort normal and breath sounds normal. No respiratory distress.  GI: Soft. He exhibits no mass. There is no hepatomegaly. There is no tenderness.  Musculoskeletal: He exhibits edema (trace pitting edema LE bilateral).  Neurological: He is alert and oriented to person, place, and time. He has normal strength.  Skin: Skin is warm. No erythema.  Psychiatric: He has a normal mood and affect. His mood appears not anxious. Cognition and memory are normal. He expresses no suicidal ideation.  Poor groomed, good eye contact.      ASSESSMENT AND PLAN:     [name] was seen today for establish care.  Diagnoses and all orders for this visit:  Insomnia, unspecified type  For now he will continue Lunesta same dose. Avoid alcohol intake at night. Melatonin ER 5 mg added. Good sleep hygiene. F/U in 4 weeks.  -     Melatonin ER 5 MG TBCR; Take 5 mg by mouth at bedtime. -     Eszopiclone 3 MG TABS; Take 1 tablet (3 mg total) by mouth at bedtime. Take immediately before bedtime  BMI 33.0-33.9,adult  We discussed benefits of wt loss as well as adverse effects of obesity. Consistency with healthy diet and physical activity recommended. Portion controlled, continue regular exercise.  Episode of recurrent major depressive disorder, unspecified depression episode severity (HCC)  Decrease Lexapro from 20 mg to 10 mg. Effexor added. Instructed about warning signs. F/U in 4 weeks.  -     venlafaxine XR (EFFEXOR XR) 75 MG 24 hr capsule; Take 1 capsule (75 mg total) by mouth daily with breakfast.  Anxiety disorder, unspecified type  Effexor added. Psycotherapy may help, recommended.   Hyperlipidemia, unspecified hyperlipidemia type  No changes in current management. Next FLP in 02/2017.  Need for prophylactic vaccination and inoculation against influenza -     Flu Vaccine QUAD 36+ mos IM                [name] G. [place], MD  [facility]. [place] office.

## 2016-11-17 NOTE — Progress Notes (Signed)
Pre visit review using our clinic review tool, if applicable. No additional management support is needed unless otherwise documented below in the visit note. 

## 2016-12-15 ENCOUNTER — Encounter: Payer: Self-pay | Admitting: Family Medicine

## 2016-12-15 ENCOUNTER — Ambulatory Visit (INDEPENDENT_AMBULATORY_CARE_PROVIDER_SITE_OTHER): Payer: Managed Care, Other (non HMO) | Admitting: Family Medicine

## 2016-12-15 VITALS — BP 130/80 | HR 71 | Resp 12 | Ht 74.0 in | Wt 259.2 lb

## 2016-12-15 DIAGNOSIS — F331 Major depressive disorder, recurrent, moderate: Secondary | ICD-10-CM | POA: Insufficient documentation

## 2016-12-15 DIAGNOSIS — G4700 Insomnia, unspecified: Secondary | ICD-10-CM

## 2016-12-15 MED ORDER — QUETIAPINE FUMARATE 100 MG PO TABS: 100.0000 mg | ORAL_TABLET | Freq: Every day | ORAL | 1 refills | Status: DC

## 2016-12-15 MED ORDER — ESZOPICLONE 3 MG PO TABS: 3.0000 mg | ORAL_TABLET | Freq: Every day | ORAL | 2 refills | Status: DC

## 2016-12-15 NOTE — Progress Notes (Signed)
HPI:   [name] is a 56 y.o. male, who is here today to follow on depression , anxiety, and insomnia.  I last saw him 11/17/2016, when recommended decreasing Lexapro from 40 mg to 20 mg daily and starting Effexor XR 75 mg daily.  He tells me he took Lexapro 20 mg for about 10 days then discontinue it abruptly, he denies any withdrawal-like symptom. He is tolerating Effexor XR 75 mg with, he denies side effects, no major difference in depression or anxiety.  He denies worsening depression or suicidal thoughts.  He is still having problems with sleep. He has tried a few other medications but have not helped or have caused side effects. He remembers trying Belsomra and Ambien among others.   Ambien caused waking up in the middle of the night and open cabinets in his kitchen and snacking, noted food out of fridge next morning. Currently on Lunesta, which is not helping much. He is sleeping an hour at the time, frequently he cannot go back to sleep at all. It takes him about 1-2 hours to fall asleep. Also taking Melatonin ER 5 mg.  He states that he has learned "to control" his thoughts, he tells me that his "mind race." He told me last OV he tried his sister Clonazepam and did "great." If he sleeps at least 4 hours he feels good. He states that he feels fatigue next day, able to function during the day but at night when he gets home he feel "awful."    Decreased caffeine intake. He feels better after drinking 2 beers nightly, it relaxes him.    Review of Systems  Constitutional: Positive for fatigue. Negative for activity change, appetite change, fever and unexpected weight change.  HENT: Negative for mouth sores, nosebleeds, sore throat, trouble swallowing and voice change.   Cardiovascular: Negative for chest pain and palpitations.  Gastrointestinal: Negative for abdominal pain, nausea and vomiting.  Musculoskeletal: Negative for gait problem and myalgias.    Neurological: Negative for syncope, weakness and headaches.  Psychiatric/Behavioral: Positive for sleep disturbance. Negative for confusion, hallucinations and suicidal ideas. The patient is nervous/anxious.       Current Outpatient Prescriptions on File Prior to Visit  Medication Sig Dispense Refill  . levothyroxine (SYNTHROID, LEVOTHROID) 75 MCG tablet Take 75 mcg by mouth daily before breakfast.    . Melatonin ER 5 MG TBCR Take 5 mg by mouth at bedtime. 90 tablet 1  . venlafaxine XR (EFFEXOR XR) 75 MG 24 hr capsule Take 1 capsule (75 mg total) by mouth daily with breakfast. 30 capsule 2   No current facility-administered medications on file prior to visit.      Past Medical History:  Diagnosis Date  . Anxiety   . Carpal tunnel syndrome of right wrist   . Depression   . Hypothyroidism    No Known Allergies  Social History   Social History  . Marital status: Single    Spouse name: N/A  . Number of children: N/A  . Years of education: N/A   Social History Main Topics  . Smoking status: Former Smoker    Packs/day: 1.00    Years: 8.00    Types: Cigarettes    Quit date: 12/05/1986  . Smokeless tobacco: Never Used  . Alcohol use 4.2 oz/week    7 Glasses of wine per week  . Drug use: No  . Sexual activity: Yes   Other Topics Concern  . None  Social History Narrative  . None    Vitals:   12/15/16 0756  BP: 130/80  Pulse: 71  Resp: 12   Body mass index is 33.29 kg/m.    Physical Exam  Nursing note and vitals reviewed. Constitutional: He is oriented to person, place, and time. He appears well-developed. No distress.  HENT:  Head: Atraumatic.  Mouth/Throat: Oropharynx is clear and moist and mucous membranes are normal.  Eyes: Conjunctivae and EOM are normal.  Cardiovascular: Normal rate and regular rhythm.   No murmur heard. Respiratory: Effort normal and breath sounds normal. No respiratory distress.  Musculoskeletal: He exhibits edema (trace pitting  edema LE bilateral).  Neurological: He is alert and oriented to person, place, and time. He has normal strength. Coordination and gait normal.  Skin: Skin is warm. No erythema.  Psychiatric: His mood appears anxious. Cognition and memory are normal. He expresses no suicidal ideation.  well groomed, good eye contact.      ASSESSMENT AND PLAN:     Marc Sanders was seen today for follow-up.  Diagnoses and all orders for this visit:   Insomnia, unspecified type  He does not want a medication that can cause addiction, so not interested in changing Lunesta for Clonazepam , he would like something that he can take with his current meds. We discussed a few options: Doxepin, Trazodone, and Seroquel. Because he mentions racing thoughts + still depression I recommend Seroquel 100 mg. We discussed some side effects. Good sleep hygiene. Recommend avoiding beer intake. F/U in 3 months.   -     QUEtiapine (SEROQUEL) 100 MG tablet; Take 1 tablet (100 mg total) by mouth at bedtime. -     Eszopiclone 3 MG TABS; Take 1 tablet (3 mg total) by mouth at bedtime. Take immediately before bedtime  Depression, major, recurrent, moderate (HCC)  No changes in Effexor for now. Seroquel 100 mg added today. Instructed about warning signs. F/U in 3 months, before if needed.    -     QUEtiapine (SEROQUEL) 100 MG tablet; Take 1 tablet (100 mg total) by mouth at bedtime.     -Marc Sanders was advised to return sooner than planned today if new concerns arise.       Marc Thien G. [place], MD  [facility]. [place] office.

## 2016-12-15 NOTE — Patient Instructions (Addendum)
A few things to remember from today's visit:   Insomnia, unspecified type - Plan: QUEtiapine (SEROQUEL) 100 MG tablet, Eszopiclone 3 MG TABS  Depression, major, recurrent, moderate (HCC) - Plan: QUEtiapine (SEROQUEL) 100 MG tablet  Today Seroquel 100 mg add at night to se if it helps sleep as well as depression and mind racing.  No changes in Effexor or Lunesta.  Good sleep hygiene. Beer can cause insomnia.  Continue working on the folowing:  The following tips can help you develop better sleep habits: Go to bed and wake up at the same time each day Lie down to sleep only when sleepy. If you can't sleep after 20 minutes, get out of bed and go to another room; return to the bedroom when you are tired; repeat as necessary. Use bedroom for sleep and sex only. Don't do things in bed that might keep you awake, like watching television, reading, talking on the phone, or worrying Avoid caffeine, nicotine, or alcohol for at least four to six hours beforebedtime\ls1Avoid strenuous exercise within four hours of bedtime. Avoid daytime napping. Relax before going to bed. Avoid eating large meals or drinking a lot of water or other liquids in the evening. Keep the bedroom a comfortable temperature. Use earplugs if noise is a problem. Expose yourself to daytime light for at least 30 minutes each morning   Please be sure medication list is accurate. If a new problem present, please set up appointment sooner than planned today.

## 2016-12-15 NOTE — Progress Notes (Signed)
Pre visit review using our clinic review tool, if applicable. No additional management support is needed unless otherwise documented below in the visit note. 

## 2017-02-09 ENCOUNTER — Encounter: Payer: Self-pay | Admitting: Family Medicine

## 2017-02-09 ENCOUNTER — Telehealth: Payer: Self-pay | Admitting: Family Medicine

## 2017-02-09 DIAGNOSIS — F339 Major depressive disorder, recurrent, unspecified: Secondary | ICD-10-CM

## 2017-02-09 DIAGNOSIS — F331 Major depressive disorder, recurrent, moderate: Secondary | ICD-10-CM

## 2017-02-09 DIAGNOSIS — G4700 Insomnia, unspecified: Secondary | ICD-10-CM

## 2017-02-09 MED ORDER — VENLAFAXINE HCL ER 75 MG PO CP24: 75.0000 mg | ORAL_CAPSULE | Freq: Every day | ORAL | 1 refills | Status: DC

## 2017-02-09 MED ORDER — QUETIAPINE FUMARATE 100 MG PO TABS: 100.0000 mg | ORAL_TABLET | Freq: Every day | ORAL | 1 refills | Status: DC

## 2017-02-09 NOTE — Telephone Encounter (Signed)
Rxs sent

## 2017-02-09 NOTE — Telephone Encounter (Signed)
Pt needs refills on seroquel 100 mg and effexor xr 75 mg #90 each w/refills sent to [facility] [address] in [place] phone #[phone]

## 2017-03-14 DIAGNOSIS — E039 Hypothyroidism, unspecified: Secondary | ICD-10-CM | POA: Insufficient documentation

## 2017-03-14 NOTE — Progress Notes (Signed)
HPI:  [name] is a 56 y.o.male here today for his routine physical examination.  He lives with his adopted 35 yo son. He denies any violence towards him.  Regular exercise 3 or more times per week: Yes,he goes to the gym 3-4 times per week. Following a healthy diet: Tries to do so.   Chronic medical problems: Insomnia,depression,anxiety,HLD among some.   Hx of STD's: Denies. He has not ben sexually active in many years.  Immunization History  Administered Date(s) Administered  . Influenza,inj,Quad PF,36+ Mos 11/17/2016  . Tdap 03/15/2017    -Hep C screening: Denies risk factors. -HIV screening: Never.  Last colon cancer screening: At age 85,negative and 10 years f/u recommended. Last prostate ca screening: 02/2016.He denies urinary frequency,nocturia,or urine dribbling.  -Denies high alcohol intake or Hx of illicit drug use. Still drinking 2 beers at night to relax him.  Former smoker.  -Concerns and/or follow up today: Still feeling anxious when he gets home after a long day of work,beer helps but he does not want to continue this behavior.He has decreased beer intake. He takes Lunesta,Seroquel,and Effexor.  Otherwise his depression and anxiety much better. Sleeping "great",6-7 hours,feels rested next day.  Tolerating medications well with no side effects reported.   Review of Systems  Constitutional: Negative for activity change, appetite change, fatigue and fever.  HENT: Negative for dental problem, hearing loss, mouth sores, nosebleeds, sore throat, trouble swallowing and voice change.   Eyes: Negative for redness and visual disturbance.  Respiratory: Negative for cough, shortness of breath and wheezing.   Cardiovascular: Negative for chest pain, palpitations and leg swelling.  Gastrointestinal: Negative for abdominal pain, blood in stool, nausea and vomiting.  Endocrine: Negative for cold intolerance, heat intolerance, polydipsia,  polyphagia and polyuria.  Genitourinary: Negative for decreased urine volume, dysuria, genital sores, hematuria and testicular pain.  Musculoskeletal: Negative for gait problem and myalgias.  Skin: Negative for color change and rash.  Neurological: Positive for numbness (Left hand:fingers.After hand surgery 08/2016,stable). Negative for seizures, syncope, weakness and headaches.  Hematological: Negative for adenopathy. Does not bruise/bleed easily.  Psychiatric/Behavioral: Positive for sleep disturbance. Negative for confusion. The patient is nervous/anxious.   All other systems reviewed and are negative.    Current Outpatient Prescriptions on File Prior to Visit  Medication Sig Dispense Refill  . Eszopiclone 3 MG TABS Take 1 tablet (3 mg total) by mouth at bedtime. Take immediately before bedtime 30 tablet 2  . levothyroxine (SYNTHROID, LEVOTHROID) 75 MCG tablet Take 75 mcg by mouth daily before breakfast.    . Melatonin ER 5 MG TBCR Take 5 mg by mouth at bedtime. 90 tablet 1  . QUEtiapine (SEROQUEL) 100 MG tablet Take 1 tablet (100 mg total) by mouth at bedtime. 90 tablet 1  . venlafaxine XR (EFFEXOR XR) 75 MG 24 hr capsule Take 1 capsule (75 mg total) by mouth daily with breakfast. 90 capsule 1   No current facility-administered medications on file prior to visit.      Past Medical History:  Diagnosis Date  . Anxiety   . Carpal tunnel syndrome of right wrist   . Depression   . Hypothyroidism     No Known Allergies  Family History  Problem Relation Age of Onset  . Diabetes Mother   . Mental illness Mother   . Hypertension Mother   . Mental illness Father     Alzheimer's  . Hypertension Father   . AAA (abdominal aortic aneurysm)  Father   . Mental illness Sister   . AAA (abdominal aortic aneurysm) Paternal Uncle     Social History   Social History  . Marital status: Single    Spouse name: N/A  . Number of children: N/A  . Years of education: N/A   Social History  Main Topics  . Smoking status: Former Smoker    Packs/day: 1.00    Years: 8.00    Types: Cigarettes    Quit date: 12/05/1986  . Smokeless tobacco: Never Used  . Alcohol use 4.2 oz/week    7 Glasses of wine per week  . Drug use: No  . Sexual activity: Yes   Other Topics Concern  . None   Social History Narrative  . None     Vitals:   03/15/17 0747  BP: 130/78  Pulse: 77  Resp: 12   Body mass index is 34.06 kg/m.  O2 sat 96% at RA  Wt Readings from Last 3 Encounters:  03/15/17 265 lb 4 oz (120.3 kg)  12/15/16 259 lb 4 oz (117.6 kg)  11/17/16 258 lb (117 kg)    Physical Exam  Nursing note and vitals reviewed. Constitutional: He is oriented to person, place, and time. He appears well-developed. No distress.  HENT:  Head: Atraumatic.  Right Ear: Hearing, tympanic membrane, external ear and ear canal normal.  Left Ear: Hearing, tympanic membrane, external ear and ear canal normal.  Mouth/Throat: Uvula is midline, oropharynx is clear and moist and mucous membranes are normal.  Eyes: Conjunctivae and EOM are normal. Pupils are equal, round, and reactive to light.  Neck: Normal range of motion. No tracheal deviation present. No thyromegaly present.  Cardiovascular: Normal rate and regular rhythm.   No murmur heard. Pulses:      Dorsalis pedis pulses are 2+ on the right side, and 2+ on the left side.  Respiratory: Effort normal and breath sounds normal. No stridor. No respiratory distress.  GI: Soft. He exhibits no mass. There is no tenderness.  Genitourinary:  Genitourinary Comments: Refused,no concerns.  Musculoskeletal: He exhibits no edema or tenderness.  No major deformities appreciated and no signs of synovitis.  Lymphadenopathy:    He has no cervical adenopathy.       Right: No supraclavicular adenopathy present.       Left: No supraclavicular adenopathy present.  Neurological: He is alert and oriented to person, place, and time. He has normal strength. No  cranial nerve deficit. Coordination and gait normal.  Symmetric bicep and patellar DTR's 1+-2+,bilateral.  Skin: Skin is warm. No erythema.  Psychiatric: He has a normal mood and affect.     ASSESSMENT AND PLAN:   Discussed the following assessment and plan:   Marc Sanders was seen today for annual exam.  Diagnoses and all orders for this visit:  Routine general medical examination at a health care facility   We discussed the importance of regular physical activity and healthy diet for prevention of chronic illness and/or complications. Preventive guidelines reviewed. Vaccination up dated. Instead beer he can drink wine with moderation. He would like to skip prostate screening this year,education about recommendations provided.  Next CPE in 1 year.   -     HIV antibody  Hyperlipidemia, unspecified hyperlipidemia type No changes in current management, non pharmacologic treatment. We will follow labs done today and will give further recommendations accordingly. F/U in 6-12 months.   -     Lipid panel  Diabetes mellitus screening -  Comprehensive metabolic panel -     Hemoglobin A1c  Encounter for hepatitis C virus screening test for high risk patient -     Hepatitis C antibody screen  Hypothyroidism, unspecified type  No changes in current management, will follow labs done today and will give further recommendations accordingly. F/U in 6-12 months.  -     TSH -     T4, free  Need for diphtheria-tetanus-pertussis (Tdap) vaccine, adult/adolescent -     Tdap vaccine greater than or equal to 7yo IM  Anxiety disorder, unspecified type  No changes in Effexor. He will try Hydroxyzine once at night as needed for anxiety. F/U in 3-4 months,before if needed.   -     hydrOXYzine (ATARAX/VISTARIL) 25 MG tablet; Take 1 tablet (25 mg total) by mouth daily as needed for itching.     Return in about 4 months (around 07/15/2017) for depression,anxiety.    Marc Sanders G.  [place], MD  [facility]. [place] office.

## 2017-03-15 ENCOUNTER — Encounter: Payer: Self-pay | Admitting: Family Medicine

## 2017-03-15 ENCOUNTER — Ambulatory Visit (INDEPENDENT_AMBULATORY_CARE_PROVIDER_SITE_OTHER): Payer: Managed Care, Other (non HMO) | Admitting: Family Medicine

## 2017-03-15 VITALS — BP 130/78 | HR 77 | Resp 12 | Ht 74.0 in | Wt 265.2 lb

## 2017-03-15 DIAGNOSIS — Z23 Encounter for immunization: Secondary | ICD-10-CM

## 2017-03-15 DIAGNOSIS — Z9189 Other specified personal risk factors, not elsewhere classified: Secondary | ICD-10-CM | POA: Diagnosis not present

## 2017-03-15 DIAGNOSIS — Z0000 Encounter for general adult medical examination without abnormal findings: Secondary | ICD-10-CM | POA: Diagnosis not present

## 2017-03-15 DIAGNOSIS — E039 Hypothyroidism, unspecified: Secondary | ICD-10-CM

## 2017-03-15 DIAGNOSIS — E785 Hyperlipidemia, unspecified: Secondary | ICD-10-CM | POA: Diagnosis not present

## 2017-03-15 DIAGNOSIS — F419 Anxiety disorder, unspecified: Secondary | ICD-10-CM | POA: Diagnosis not present

## 2017-03-15 DIAGNOSIS — Z1159 Encounter for screening for other viral diseases: Secondary | ICD-10-CM | POA: Diagnosis not present

## 2017-03-15 DIAGNOSIS — Z131 Encounter for screening for diabetes mellitus: Secondary | ICD-10-CM | POA: Diagnosis not present

## 2017-03-15 LAB — COMPREHENSIVE METABOLIC PANEL
ALT: 35 U/L (ref 0–53)
AST: 25 U/L (ref 0–37)
Albumin: 4.5 g/dL (ref 3.5–5.2)
Alkaline Phosphatase: 51 U/L (ref 39–117)
BUN: 13 mg/dL (ref 6–23)
CALCIUM: 9.1 mg/dL (ref 8.4–10.5)
CHLORIDE: 106 meq/L (ref 96–112)
CO2: 28 mEq/L (ref 19–32)
Creatinine, Ser: 1.15 mg/dL (ref 0.40–1.50)
GFR: 70.3 mL/min (ref >60.00)
GLUCOSE: 113 mg/dL — AB (ref 70–99)
POTASSIUM: 4.3 meq/L (ref 3.5–5.1)
Sodium: 141 mEq/L (ref 135–145)
Total Bilirubin: 0.5 mg/dL (ref 0.2–1.2)
Total Protein: 7.3 g/dL (ref 6.0–8.3)

## 2017-03-15 LAB — LIPID PANEL
CHOLESTEROL: 164 mg/dL (ref 0–200)
HDL: 53.2 mg/dL (ref >39.00)
LDL CALC: 94 mg/dL (ref 0–99)
NonHDL: 110.43
TRIGLYCERIDES: 81 mg/dL (ref 0.0–149.0)
Total CHOL/HDL Ratio: 3
VLDL: 16.2 mg/dL (ref 0.0–40.0)

## 2017-03-15 LAB — HEMOGLOBIN A1C: Hgb A1c MFr Bld: 5.9 % (ref 4.6–6.5)

## 2017-03-15 LAB — TSH: TSH: 5.34 u[IU]/mL — ABNORMAL HIGH (ref 0.35–4.50)

## 2017-03-15 LAB — T4, FREE: FREE T4: 0.63 ng/dL (ref 0.60–1.60)

## 2017-03-15 MED ORDER — HYDROXYZINE HCL 25 MG PO TABS: 25.0000 mg | ORAL_TABLET | Freq: Every day | ORAL | 1 refills | Status: DC | PRN

## 2017-03-15 NOTE — Progress Notes (Signed)
Pre visit review using our clinic review tool, if applicable. No additional management support is needed unless otherwise documented below in the visit note. 

## 2017-03-15 NOTE — Patient Instructions (Addendum)
A few things to remember from today's visit:   Prostate cancer screening  Hyperlipidemia, unspecified hyperlipidemia type - Plan: Lipid panel  Diabetes mellitus screening - Plan: Comprehensive metabolic panel, Hemoglobin A1c  Encounter for hepatitis C virus screening test for high risk patient - Plan: Hepatitis C antibody screen  Hypothyroidism, unspecified type - Plan: TSH, T4, free  Routine general medical examination at a health care facility - Plan: HIV antibody    At least 150 minutes of moderate exercise per week, daily brisk walking for 15-30 min is a good exercise option. Healthy diet low in saturated (animal) fats and sweets and consisting of fresh fruits and vegetables, lean meats such as fish and white chicken and whole grains.  - Vaccines:  Tdap vaccine every 10 years. Given today.  Shingles vaccine recommended at age 45, could be given after 55 years of age but not sure about insurance coverage.  Pneumonia vaccines:  Prevnar 13 at 65 and Pneumovax at 85.   -Screening recommendations for low/normal risk males:  Screening for diabetes at age 85-45 and every 3 years.    Colonoscopy for colon cancer screening at age 61 and until age 64.We need to obtain a copy.  Prostate cancer screening: some controversy.We decided to do it next year,since you had it last year.         Please be sure medication list is accurate. If a new problem present, please set up appointment sooner than planned today.

## 2017-03-16 ENCOUNTER — Other Ambulatory Visit: Payer: Self-pay | Admitting: Family Medicine

## 2017-03-16 DIAGNOSIS — G4700 Insomnia, unspecified: Secondary | ICD-10-CM

## 2017-03-16 LAB — HEPATITIS C ANTIBODY: HCV Ab: NEGATIVE

## 2017-03-16 LAB — HIV ANTIBODY (ROUTINE TESTING W REFLEX): HIV: NONREACTIVE

## 2017-03-16 NOTE — Telephone Encounter (Signed)
Do you mind calling pharmacy and ask name under which he is filling Rx and DOB.I can not find information on [place] controlled substance web. Thanks, BJ

## 2017-03-17 ENCOUNTER — Other Ambulatory Visit: Payer: Self-pay | Admitting: Family Medicine

## 2017-03-17 ENCOUNTER — Encounter: Payer: Self-pay | Admitting: Family Medicine

## 2017-03-17 DIAGNOSIS — E039 Hypothyroidism, unspecified: Secondary | ICD-10-CM

## 2017-03-17 MED ORDER — LEVOTHYROXINE SODIUM 75 MCG PO TABS: ORAL_TABLET | ORAL | 2 refills | Status: DC

## 2017-03-17 NOTE — Telephone Encounter (Signed)
They have him as: Marc Sanders DOB: [birth year].  He is filling it at a pharmacy in [place], [place]. So I'm not sure if that's what's causing the issue.

## 2017-03-17 NOTE — Telephone Encounter (Addendum)
I could not find information. Eszopiclone 3 mg can be called in to continue at bedtime as needed for insomnia. It seems like he may be due 03/18/17. # 30/2 Thanks, BJ

## 2017-03-20 MED ORDER — ESZOPICLONE 3 MG PO TABS: ORAL_TABLET | ORAL | 2 refills | Status: DC

## 2017-03-20 NOTE — Telephone Encounter (Signed)
Rx called in 

## 2017-06-21 ENCOUNTER — Other Ambulatory Visit: Payer: Self-pay | Admitting: Family Medicine

## 2017-06-21 DIAGNOSIS — G4700 Insomnia, unspecified: Secondary | ICD-10-CM

## 2017-06-21 NOTE — Telephone Encounter (Signed)
Okay to refill?  Can't find patient in the [place] database because he lives in [place].

## 2017-06-23 NOTE — Telephone Encounter (Signed)
I cannot find date in [place] controlled substance. Can you please call to pharmacy to verify he is due and no other provider prescribing a controlled medication.  Then Rx can be called in for Eszopiclone 3 mg to take as needed at bedtime. # 30/2. Thanks, BJ

## 2017-06-23 NOTE — Telephone Encounter (Signed)
Rx lasted filled 05/22/17.  Rx phoned in as directed.

## 2017-07-18 NOTE — Progress Notes (Signed)
HPI:   Marc Sanders is a 56 y.o. male, who is here today to follow on some chronic medical problems.  She was last seen on 03/15/17 for his CPE.  Insomnia and depression: He is currently on Lunesta 3 mg at bedtime and Seroquel 100 mg at bedtime (added to help with ("mind race" thoughts) He is also on Effexor 75 mg daily. In regard to depression, he has noted great improvement. He is writing again and dealing better with stress. Hhe can perform well at work, no issues with anxiety until he gets home.  He wonders if Xanax would help, also express concerns about side effects.  He denies suicidal thoughts.  He has tried Ambien before but cause sleep walking.  Sleeping much better, about 6 hours. Waking up about 1-2 times but able to go back to sleep.   He is still drinking beer when he gets home after work, drinks about 40 oz. Last OV Hydroxyzine 25 mg was added for anxiety but did not help. He has noted wt gain and attributes it to beer.   Hypothyroidism:  Currently he is on Levothyroxine 75 mcg daily x 6 and 1.5 tab x 1. Tolerating medication well, no side effects reported. He has not noted dysphagia, palpitations, abdominal pain, changes in bowel habits, tremor, cold/heat intolerance, or abnormal weight loss.  Lab Results  Component Value Date   TSH 5.34 (H) 03/15/2017    New problem: For the past "few weeks" he has noticed intermittent gross hematuria. He denies prior history, he denies history of nephrolithiasis, he denies other urinary symptoms.  No history of trauma or urethral discharge. Decrease in urine stream, which has been stable for years.  Denies abdominal pain, nausea, vomiting, or changes in bowel habits.    Review of Systems  Constitutional: Negative for activity change, appetite change, fatigue and fever.  HENT: Negative for mouth sores, nosebleeds, sore throat, trouble swallowing and voice change.   Eyes: Negative for redness and  visual disturbance.  Respiratory: Negative for cough, shortness of breath and wheezing.   Cardiovascular: Negative for chest pain, palpitations and leg swelling.  Gastrointestinal: Negative for abdominal pain, nausea and vomiting.  Endocrine: Negative for cold intolerance and heat intolerance.  Genitourinary: Positive for hematuria. Negative for decreased urine volume, dysuria, frequency and testicular pain.  Musculoskeletal: Negative for gait problem and myalgias.  Neurological: Negative for dizziness, syncope, weakness and headaches.  Hematological: Negative for adenopathy. Does not bruise/bleed easily.  Psychiatric/Behavioral: Negative for confusion, hallucinations and suicidal ideas. The patient is nervous/anxious.       Current Outpatient Prescriptions on File Prior to Visit  Medication Sig Dispense Refill  . hydrOXYzine (ATARAX/VISTARIL) 25 MG tablet Take 1 tablet (25 mg total) by mouth daily as needed for itching. 30 tablet 1  . levothyroxine (SYNTHROID, LEVOTHROID) 75 MCG tablet Mondays 1.5 tab. Rest of days 1 tab. 92 tablet 2  . Melatonin ER 5 MG TBCR Take 5 mg by mouth at bedtime. 90 tablet 1  . QUEtiapine (SEROQUEL) 100 MG tablet Take 1 tablet (100 mg total) by mouth at bedtime. 90 tablet 1  . venlafaxine XR (EFFEXOR XR) 75 MG 24 hr capsule Take 1 capsule (75 mg total) by mouth daily with breakfast. 90 capsule 1   No current facility-administered medications on file prior to visit.      Past Medical History:  Diagnosis Date  . Anxiety   . Carpal tunnel syndrome of right wrist   .  Depression   . Hypothyroidism    Not on File  Social History   Social History  . Marital status: Single    Spouse name: N/A  . Number of children: N/A  . Years of education: N/A   Social History Main Topics  . Smoking status: Former Smoker    Packs/day: 1.00    Years: 8.00    Types: Cigarettes    Quit date: 12/05/1986  . Smokeless tobacco: Never Used  . Alcohol use 4.2 oz/week    7  Glasses of wine per week  . Drug use: No  . Sexual activity: Yes   Other Topics Concern  . None   Social History Narrative  . None    Vitals:   07/19/17 0834  BP: 140/90  Pulse: 68  Resp: 12  SpO2: 96%   Body mass index is 35.63 kg/m.  Wt Readings from Last 3 Encounters:  07/19/17 277 lb 8 oz (125.9 kg)  03/15/17 265 lb 4 oz (120.3 kg)  12/15/16 259 lb 4 oz (117.6 kg)    Physical Exam  Nursing note and vitals reviewed. Constitutional: He is oriented to person, place, and time. He appears well-developed. No distress.  HENT:  Head: Normocephalic and atraumatic.  Mouth/Throat: Oropharynx is clear and moist and mucous membranes are normal.  Eyes: Pupils are equal, round, and reactive to light. Conjunctivae and EOM are normal.  Neck: No tracheal deviation present. No thyroid mass and no thyromegaly (palpable) present.  Cardiovascular: Normal rate and regular rhythm.   No murmur heard. Pulses:      Dorsalis pedis pulses are 2+ on the right side, and 2+ on the left side.  Respiratory: Effort normal and breath sounds normal. No respiratory distress.  GI: Soft. He exhibits no mass. There is no hepatomegaly. There is no tenderness. There is no CVA tenderness.  Genitourinary: Rectal exam shows no tenderness. Prostate is enlarged (mild). Prostate is not tender.  Musculoskeletal: He exhibits edema (Pitting trace lower extremity edema,bilateral). He exhibits no tenderness.  Lymphadenopathy:    He has no cervical adenopathy.  Neurological: He is alert and oriented to person, place, and time. He has normal strength. Coordination and gait normal.  Skin: Skin is warm. No rash noted. No erythema.  Psychiatric: His mood appears anxious. He expresses no suicidal ideation.  Well groomed, good eye contact.    ASSESSMENT AND PLAN:   Marc Sanders was seen today for follow-up.  Diagnoses and all orders for this visit:  Lab Results  Component Value Date   PSA 0.36 07/19/2017   Lab  Results  Component Value Date   TSH 3.97 07/19/2017   Lab Results  Component Value Date   CREATININE 1.01 07/19/2017   BUN 17 07/19/2017   NA 138 07/19/2017   K 4.3 07/19/2017   CL 103 07/19/2017   CO2 30 07/19/2017    Insomnia, unspecified type  Improved. No changes in Seroquel. He will try to wean off Lunesta, we are adding Clonazepam to help with night time anxiety. Continue Melatonin. Good sleep hygiene. F/U in 2 months.  -     Eszopiclone 3 MG TABS; 1/2 tab daily for a week then q 2 days and stop.  Gross hematuria  New problem. Urine today negative but because intermittent gross hematuria for "a few weeks",urology evaluation recommended. Former smoker. Possible causes dicussed. Further recommendations will be given according to lab results.  -     Basic metabolic panel -     Urinalysis,  Routine w reflex microscopic -     PSA -     Culture, Urine -     Ambulatory referral to Urology  Anxiety disorder, unspecified type  Not well controlled. He can try Clonazepam, we reviewed some side effects. Lunesta to wean off. He can star Clonazepam 0.5 mg and increase to 1 mg if needed after Lunesta is discontinued. No changes in Effexor. F/U in 2 months.  -     clonazePAM (KLONOPIN) 0.5 MG tablet; Take 1 tablet (0.5 mg total) by mouth daily. At night  Depression, major, recurrent, moderate (HCC)  Well controlled. No changes in Effexor and Seroquel. Instructed about warning signs.  Hypothyroidism, unspecified type  No changes in current management, will follow labs done today and will give further recommendations accordingly. F/U in 6 months.  -     TSH  Class 2 obesity without serious comorbidity with body mass index (BMI) of 35.0 to 35.9 in adult, unspecified obesity type  Gained about 12 Lb sine 03/2017. Side effects of some of his meds reviewed. Beer intake can certainly be contributing to wt gain, recommend decrease it gradually and stopping alcohol  intake. We discussed benefits of wt loss as well as adverse effects of obesity. Consistency with healthy diet and physical activity recommended.    -Marc Sanders was advised to return sooner than planned today if new concerns arise.       Betty G. [place]az[place], MD  [facility]. [place] office.

## 2017-07-19 ENCOUNTER — Encounter: Payer: Self-pay | Admitting: Family Medicine

## 2017-07-19 ENCOUNTER — Ambulatory Visit (INDEPENDENT_AMBULATORY_CARE_PROVIDER_SITE_OTHER): Payer: Managed Care, Other (non HMO) | Admitting: Family Medicine

## 2017-07-19 VITALS — BP 140/90 | HR 68 | Resp 12 | Ht 74.0 in | Wt 277.5 lb

## 2017-07-19 DIAGNOSIS — F331 Major depressive disorder, recurrent, moderate: Secondary | ICD-10-CM | POA: Diagnosis not present

## 2017-07-19 DIAGNOSIS — E669 Obesity, unspecified: Secondary | ICD-10-CM

## 2017-07-19 DIAGNOSIS — G4700 Insomnia, unspecified: Secondary | ICD-10-CM | POA: Diagnosis not present

## 2017-07-19 DIAGNOSIS — E039 Hypothyroidism, unspecified: Secondary | ICD-10-CM

## 2017-07-19 DIAGNOSIS — Z6833 Body mass index (BMI) 33.0-33.9, adult: Secondary | ICD-10-CM | POA: Insufficient documentation

## 2017-07-19 DIAGNOSIS — R310 Gross hematuria: Secondary | ICD-10-CM | POA: Diagnosis not present

## 2017-07-19 DIAGNOSIS — F419 Anxiety disorder, unspecified: Secondary | ICD-10-CM | POA: Diagnosis not present

## 2017-07-19 DIAGNOSIS — Z6835 Body mass index (BMI) 35.0-35.9, adult: Secondary | ICD-10-CM | POA: Diagnosis not present

## 2017-07-19 LAB — TSH: TSH: 3.97 u[IU]/mL (ref 0.35–4.50)

## 2017-07-19 LAB — URINALYSIS, ROUTINE W REFLEX MICROSCOPIC
Bilirubin Urine: NEGATIVE
Hgb urine dipstick: NEGATIVE
KETONES UR: NEGATIVE
Leukocytes, UA: NEGATIVE
Nitrite: NEGATIVE
PH: 6 (ref 5.0–8.0)
RBC / HPF: NONE SEEN (ref 0–?)
SPECIFIC GRAVITY, URINE: 1.025 (ref 1.000–1.030)
TOTAL PROTEIN, URINE-UPE24: NEGATIVE
Urine Glucose: NEGATIVE
Urobilinogen, UA: 0.2 (ref 0.0–1.0)
WBC UA: NONE SEEN (ref 0–?)

## 2017-07-19 LAB — BASIC METABOLIC PANEL
BUN: 17 mg/dL (ref 6–23)
CALCIUM: 8.8 mg/dL (ref 8.4–10.5)
CHLORIDE: 103 meq/L (ref 96–112)
CO2: 30 mEq/L (ref 19–32)
CREATININE: 1.01 mg/dL (ref 0.40–1.50)
GFR: 81.55 mL/min (ref >60.00)
Glucose, Bld: 113 mg/dL — ABNORMAL HIGH (ref 70–99)
Potassium: 4.3 mEq/L (ref 3.5–5.1)
Sodium: 138 mEq/L (ref 135–145)

## 2017-07-19 LAB — PSA: PSA: 0.36 ng/mL (ref 0.10–4.00)

## 2017-07-19 MED ORDER — ESZOPICLONE 3 MG PO TABS: ORAL_TABLET | ORAL | 0 refills | Status: DC

## 2017-07-19 MED ORDER — CLONAZEPAM 0.5 MG PO TABS: 0.5000 mg | ORAL_TABLET | Freq: Every day | ORAL | 0 refills | Status: DC

## 2017-07-19 NOTE — Patient Instructions (Signed)
A few things to remember from today's visit:   Insomnia, unspecified type - Plan: Eszopiclone 3 MG TABS  Anxiety disorder, unspecified type - Plan: clonazePAM (KLONOPIN) 0.5 MG tablet  Depression, major, recurrent, moderate (HCC)  Hypothyroidism, unspecified type - Plan: TSH  Gross hematuria - Plan: Basic metabolic panel, Urinalysis, Routine w reflex microscopic, PSA, Culture, Urine, Ambulatory referral to Urology  Today Lunesta decreased and planning on weaning off. Clonazepam added, you can take 2 tabs once Lunesta is discontinued. Urology referral. Caution with Ibuprofen because blood pressure, monitor at home blood pressure.  Decrease beer intake.   Please be sure medication list is accurate. If a new problem present, please set up appointment sooner than planned today.

## 2017-07-20 ENCOUNTER — Encounter: Payer: Self-pay | Admitting: Family Medicine

## 2017-07-20 LAB — URINE CULTURE: ORGANISM ID, BACTERIA: NO GROWTH

## 2017-07-21 MED ORDER — ATORVASTATIN CALCIUM 40 MG PO TABS: 40.0000 mg | ORAL_TABLET | Freq: Every day | ORAL | 2 refills | Status: DC

## 2017-07-22 ENCOUNTER — Encounter: Payer: Self-pay | Admitting: Family Medicine

## 2017-08-04 ENCOUNTER — Ambulatory Visit (INDEPENDENT_AMBULATORY_CARE_PROVIDER_SITE_OTHER): Payer: Managed Care, Other (non HMO) | Admitting: Family Medicine

## 2017-08-04 ENCOUNTER — Encounter: Payer: Self-pay | Admitting: Family Medicine

## 2017-08-04 VITALS — BP 158/100 | Temp 99.3°F | Wt 280.0 lb

## 2017-08-04 DIAGNOSIS — I10 Essential (primary) hypertension: Secondary | ICD-10-CM

## 2017-08-04 MED ORDER — LISINOPRIL-HYDROCHLOROTHIAZIDE 10-12.5 MG PO TABS: 1.0000 | ORAL_TABLET | Freq: Every day | ORAL | 1 refills | Status: DC

## 2017-08-04 MED ORDER — HYDROCHLOROTHIAZIDE 12.5 MG PO TABS: 12.5000 mg | ORAL_TABLET | Freq: Every day | ORAL | 0 refills | Status: DC

## 2017-08-04 NOTE — Progress Notes (Signed)
Subjective:    Patient ID: Marc Sanders, male    DOB: 1961/06/24, 56 y.o.   MRN: [id number]  HPI  Marc Sanders is a 56 year old male who presents today for elevated blood pressure that was noted at a [facility] [place]. He states that he has noted an increase in his blood pressure after having screenings at work. He does not monitor he BP at home. His pre-op appointment today noted a BP 200/110 then in this office BP was noted to be 176/112, and 158/100. He has a history of insomnia, depression, anxiety, class 2 obesity, and HLD. Several anxiety producing events are occurring in his life right now.  He has a son who is currently using drugs and this has been a stressor along with upcoming surgery and underlying anxiety that has not been well controlled.  He denies denies chest pain, palpitations, SOB, numbness, tingling, weakness, headaches, or edema.  He does not monitor his food choices or avoid salt. He reports eating out at least 5 times/week and he does he fried foods. He tries to avoid fast foods but reports eating fast foods every week.   Review of Systems  Constitutional: Negative for chills, fatigue and fever.  Eyes: Negative for visual disturbance.  Respiratory: Negative for cough, shortness of breath and wheezing.   Cardiovascular: Negative for chest pain, palpitations and leg swelling.  Gastrointestinal: Negative for abdominal pain, nausea and vomiting.  Skin: Negative for rash.  Neurological: Negative for dizziness, weakness, light-headedness, numbness and headaches.  Psychiatric/Behavioral:       Denies depression; notes increased anxiety with recent life stressors   Past Medical History:  Diagnosis Date  . Anxiety   . Carpal tunnel syndrome of right wrist   . Depression   . Hypothyroidism      Social History   Social History  . Marital status: Single    Spouse name: N/A  . Number of children: N/A  . Years of  education: N/A   Occupational History  . Not on file.   Social History Main Topics  . Smoking status: Former Smoker    Packs/day: 1.00    Years: 8.00    Types: Cigarettes    Quit date: 12/05/1986  . Smokeless tobacco: Never Used  . Alcohol use 4.2 oz/week    7 Glasses of wine per week  . Drug use: No  . Sexual activity: Yes   Other Topics Concern  . Not on file   Social History Narrative  . No narrative on file    Past Surgical History:  Procedure Laterality Date  . CARPAL TUNNEL RELEASE Right 03/01/2013   Procedure: CARPAL TUNNEL RELEASE;  Surgeon: Drucilla SchmidtJames P Aplington, MD;  Location: [facility];  Service: Orthopedics;  Laterality: Right;  . SHOULDER ARTHROSCOPY WITH OPEN ROTATOR CUFF REPAIR AND DISTAL CLAVICLE ACROMINECTOMY  12/19/2012   Procedure: SHOULDER ARTHROSCOPY WITH OPEN ROTATOR CUFF REPAIR AND DISTAL CLAVICLE ACROMINECTOMY;  Surgeon: Drucilla SchmidtJames P Aplington, MD;  Location: [facility];  Service: Orthopedics;  Laterality: Left;  LEFT SHOULDER ARTHROSCOPY WITH LABRAL DEBRIDEMENT AND SUBACROMIAL DECOMPRESSION DISTAL CLAVICLE RESECTION AND MINI OPEN ROTATOR CUFF REPAIR WITH ANCHOR    Family History  Problem Relation Age of Onset  . Diabetes Mother   . Mental illness Mother   . Hypertension Mother   . Mental illness Father        Alzheimer's  . Hypertension Father   . AAA (abdominal aortic aneurysm)  Father   . Mental illness Sister   . AAA (abdominal aortic aneurysm) Paternal Uncle     Not on File  Current Outpatient Prescriptions on File Prior to Visit  Medication Sig Dispense Refill  . atorvastatin (LIPITOR) 40 MG tablet Take 1 tablet (40 mg total) by mouth daily. 90 tablet 2  . clonazePAM (KLONOPIN) 0.5 MG tablet Take 1 tablet (0.5 mg total) by mouth daily. At night 20 tablet 0  . Eszopiclone 3 MG TABS 1/2 tab daily for a week then q 2 days and stop. 30 tablet 0  . IBU 800 MG tablet TAKE ONE TABLET BY MOUTH THREE TIMES A DAY WITH FOOD  0  . levothyroxine  (SYNTHROID, LEVOTHROID) 75 MCG tablet Mondays 1.5 tab. Rest of days 1 tab. 92 tablet 2  . Melatonin ER 5 MG TBCR Take 5 mg by mouth at bedtime. 90 tablet 1  . QUEtiapine (SEROQUEL) 100 MG tablet Take 1 tablet (100 mg total) by mouth at bedtime. 90 tablet 1  . venlafaxine XR (EFFEXOR XR) 75 MG 24 hr capsule Take 1 capsule (75 mg total) by mouth daily with breakfast. 90 capsule 1   No current facility-administered medications on file prior to visit.     BP (!) 158/100   Temp 99.3 F (37.4 C) (Oral)   Wt 280 lb (127 kg)   BMI 35.95 kg/m       Objective:   Physical Exam  Constitutional: He is oriented to person, place, and time. He appears well-developed and well-nourished.  Eyes: Pupils are equal, round, and reactive to light. No scleral icterus.  Neck: Neck supple.  Cardiovascular: Intact distal pulses.   Pulmonary/Chest: Effort normal and breath sounds normal. He has no wheezes. He has no rales.  Abdominal: Soft. Bowel sounds are normal. There is no tenderness.  Musculoskeletal: He exhibits edema.  Trace, pitting edema present bilaterally  Lymphadenopathy:    He has no cervical adenopathy.  Neurological: He is alert and oriented to person, place, and time. Coordination normal.  Skin: Skin is warm and dry. No rash noted.      Assessment & Plan:  1. Hypertension, essential Continued elevation of BP; history of elevated blood pressure without diagnosis of HTN; weight gain (class 2 obesity) and high salt dietary choices noted. With upcoming surgery will initiate lisinopril-HCTZ daily, monitoring of BP; DASH diet, and advised patient to follow up with PCP next week for evaluation of BP prior to upcoming knee surgery. Considered one agent of HCTZ and after review with supervising physician and discussion of BP readings at Stage 2, opted for two agents for control of BP. With his extensive history of anxiety will continue current regimen as prescribed by PCP and advised close follow up in  one week. He will monitor his BP, document readings, and agreed to follow up for further evaluation. Return precautions advised.   - lisinopril-hydrochlorothiazide (PRINZIDE,ZESTORETIC) 10-12.5 MG tablet; Take 1 tablet by mouth daily.  Dispense: 30 tablet; Refill: 1  Roddie Mc, FNP-C

## 2017-08-04 NOTE — Patient Instructions (Addendum)
Please monitor blood pressure with an arm cuff and start medication to decrease your blood pressure. Minimal Blood Pressure Goal= AVERAGE < 140/90; Ideal is an AVERAGE < 135/85. This AVERAGE should be calculated from @ least 5-7 BP readings taken @ different times of day on different days of week. You should not respond to isolated BP readings , but rather the AVERAGE for that week .Please bring your blood pressure cuff to office visits to verify that it is reliable.It can also be checked against the blood pressure device at the pharmacy. Finger or wrist cuffs are not dependable; an arm cuff is.  Important to decrease salt in your diet. See DASH diet below.   Follow up with Dr. [place]Jordan next week for evaluation of blood pressure and addition of medication.   DASH Eating Plan DASH stands for "Dietary Approaches to Stop Hypertension." The DASH eating plan is a healthy eating plan that has been shown to reduce high blood pressure (hypertension). It may also reduce your risk for type 2 diabetes, heart disease, and stroke. The DASH eating plan may also help with weight loss. What are tips for following this plan? General guidelines  Avoid eating more than 2,300 mg (milligrams) of salt (sodium) a day. If you have hypertension, you may need to reduce your sodium intake to 1,500 mg a day.  Limit alcohol intake to no more than 1 drink a day for nonpregnant women and 2 drinks a day for men. One drink equals 12 oz of beer, 5 oz of wine, or 1 oz of hard liquor.  Work with your health care provider to maintain a healthy body weight or to lose weight. Ask what an ideal weight is for you.  Get at least 30 minutes of exercise that causes your heart to beat faster (aerobic exercise) most days of the week. Activities may include walking, swimming, or biking.  Work with your health care provider or diet and nutrition specialist (dietitian) to adjust your eating plan to your individual calorie needs. Reading  food labels  Check food labels for the amount of sodium per serving. Choose foods with less than 5 percent of the Daily Value of sodium. Generally, foods with less than 300 mg of sodium per serving fit into this eating plan.  To find whole grains, look for the word "whole" as the first word in the ingredient list. Shopping  Buy products labeled as "low-sodium" or "no salt added."  Buy fresh foods. Avoid canned foods and premade or frozen meals. Cooking  Avoid adding salt when cooking. Use salt-free seasonings or herbs instead of table salt or sea salt. Check with your health care provider or pharmacist before using salt substitutes.  Do not fry foods. Cook foods using healthy methods such as baking, boiling, grilling, and broiling instead.  Cook with heart-healthy oils, such as olive, canola, soybean, or sunflower oil. Meal planning   Eat a balanced diet that includes: ? 5 or more servings of fruits and vegetables each day. At each meal, try to fill half of your plate with fruits and vegetables. ? Up to 6-8 servings of whole grains each day. ? Less than 6 oz of lean meat, poultry, or fish each day. A 3-oz serving of meat is about the same size as a deck of cards. One egg equals 1 oz. ? 2 servings of low-fat dairy each day. ? A serving of nuts, seeds, or beans 5 times each week. ? Heart-healthy fats. Healthy fats called Omega-3 fatty acids  are found in foods such as flaxseeds and coldwater fish, like sardines, salmon, and mackerel.  Limit how much you eat of the following: ? Canned or prepackaged foods. ? Food that is high in trans fat, such as fried foods. ? Food that is high in saturated fat, such as fatty meat. ? Sweets, desserts, sugary drinks, and other foods with added sugar. ? Full-fat dairy products.  Do not salt foods before eating.  Try to eat at least 2 vegetarian meals each week.  Eat more home-cooked food and less restaurant, buffet, and fast food.  When eating at  a restaurant, ask that your food be prepared with less salt or no salt, if possible. What foods are recommended? The items listed may not be a complete list. Talk with your dietitian about what dietary choices are best for you. Grains Whole-grain or whole-wheat bread. Whole-grain or whole-wheat pasta. Brown rice. Orpah Cobb. Bulgur. Whole-grain and low-sodium cereals. Pita bread. Low-fat, low-sodium crackers. Whole-wheat flour tortillas. Vegetables Fresh or frozen vegetables (raw, steamed, roasted, or grilled). Low-sodium or reduced-sodium tomato and vegetable juice. Low-sodium or reduced-sodium tomato sauce and tomato paste. Low-sodium or reduced-sodium canned vegetables. Fruits All fresh, dried, or frozen fruit. Canned fruit in natural juice (without added sugar). Meat and other protein foods Skinless chicken or [place]. Ground chicken or [place]. Pork with fat trimmed off. Fish and seafood. Egg whites. Dried beans, peas, or lentils. Unsalted nuts, nut butters, and seeds. Unsalted canned beans. Lean cuts of beef with fat trimmed off. Low-sodium, lean deli meat. Dairy Low-fat (1%) or fat-free (skim) milk. Fat-free, low-fat, or reduced-fat cheeses. Nonfat, low-sodium ricotta or cottage cheese. Low-fat or nonfat yogurt. Low-fat, low-sodium cheese. Fats and oils Soft margarine without trans fats. Vegetable oil. Low-fat, reduced-fat, or light mayonnaise and salad dressings (reduced-sodium). Canola, safflower, olive, soybean, and sunflower oils. Avocado. Seasoning and other foods Herbs. Spices. Seasoning mixes without salt. Unsalted popcorn and pretzels. Fat-free sweets. What foods are not recommended? The items listed may not be a complete list. Talk with your dietitian about what dietary choices are best for you. Grains Baked goods made with fat, such as croissants, muffins, or some breads. Dry pasta or rice meal packs. Vegetables Creamed or fried vegetables. Vegetables in a cheese sauce.  Regular canned vegetables (not low-sodium or reduced-sodium). Regular canned tomato sauce and paste (not low-sodium or reduced-sodium). Regular tomato and vegetable juice (not low-sodium or reduced-sodium). Rosita Fire. Olives. Fruits Canned fruit in a light or heavy syrup. Fried fruit. Fruit in cream or butter sauce. Meat and other protein foods Fatty cuts of meat. Ribs. Fried meat. Tomasa Blase. Sausage. Bologna and other processed lunch meats. Salami. Fatback. Hotdogs. Bratwurst. Salted nuts and seeds. Canned beans with added salt. Canned or smoked fish. Whole eggs or egg yolks. Chicken or [place] with skin. Dairy Whole or 2% milk, cream, and half-and-half. Whole or full-fat cream cheese. Whole-fat or sweetened yogurt. Full-fat cheese. Nondairy creamers. Whipped toppings. Processed cheese and cheese spreads. Fats and oils Butter. Stick margarine. Lard. Shortening. Ghee. Bacon fat. Tropical oils, such as coconut, palm kernel, or palm oil. Seasoning and other foods Salted popcorn and pretzels. Onion salt, garlic salt, seasoned salt, table salt, and sea salt. Worcestershire sauce. Tartar sauce. Barbecue sauce. Teriyaki sauce. Soy sauce, including reduced-sodium. Steak sauce. Canned and packaged gravies. Fish sauce. Oyster sauce. Cocktail sauce. Horseradish that you find on the shelf. Ketchup. Mustard. Meat flavorings and tenderizers. Bouillon cubes. Hot sauce and Tabasco sauce. Premade or packaged marinades. Premade or packaged taco  seasonings. Relishes. Regular salad dressings. Where to find more information:  National Heart, Lung, and [place]: https://wilson-eaton.com/  American Heart Association: www.heart.org Summary  The DASH eating plan is a healthy eating plan that has been shown to reduce high blood pressure (hypertension). It may also reduce your risk for type 2 diabetes, heart disease, and stroke.  With the DASH eating plan, you should limit salt (sodium) intake to 2,300 mg a day. If you have  hypertension, you may need to reduce your sodium intake to 1,500 mg a day.  When on the DASH eating plan, aim to eat more fresh fruits and vegetables, whole grains, lean proteins, low-fat dairy, and heart-healthy fats.  Work with your health care provider or diet and nutrition specialist (dietitian) to adjust your eating plan to your individual calorie needs. This information is not intended to replace advice given to you by your health care provider. Make sure you discuss any questions you have with your health care provider. Document Released: 11/10/2011 Document Revised: 11/14/2016 Document Reviewed: 11/14/2016 Elsevier Interactive Patient Education  2017 [employer].

## 2017-08-09 ENCOUNTER — Other Ambulatory Visit: Payer: Self-pay | Admitting: Family Medicine

## 2017-08-09 DIAGNOSIS — F419 Anxiety disorder, unspecified: Secondary | ICD-10-CM

## 2017-08-09 DIAGNOSIS — F339 Major depressive disorder, recurrent, unspecified: Secondary | ICD-10-CM

## 2017-08-09 NOTE — Telephone Encounter (Signed)
Okay to refill Clonazepam?   Patient lives in [place], so unable to pull him up on the database.

## 2017-08-11 NOTE — Telephone Encounter (Signed)
Clonazepam 0.5 mg to continue one tab at night as needed can be called in. #30/1. Please inform pharmacists that Alfonso PattenLunesta was discontinued. Also can you ask pharmacist if there is anyway we can obtain fill information. I tried to use a more general inquiring form on [employer] web that is supposed to include all states but not able to pull it up. It requires either SSN or License #, not sure which one he uses for filling Rx at his pharmacy.  Thanks, BJ

## 2017-08-11 NOTE — Telephone Encounter (Signed)
Rx phoned in.  I spoke with the pharmacist. He said that they always check their database before they fill the prescriptions. In [place], they can view other states. I'm not sure if we can do the same.

## 2017-08-16 ENCOUNTER — Other Ambulatory Visit: Payer: Self-pay | Admitting: Family Medicine

## 2017-08-16 DIAGNOSIS — F331 Major depressive disorder, recurrent, moderate: Secondary | ICD-10-CM

## 2017-08-16 DIAGNOSIS — G4700 Insomnia, unspecified: Secondary | ICD-10-CM

## 2017-08-24 ENCOUNTER — Encounter: Payer: Self-pay | Admitting: Family Medicine

## 2017-09-18 NOTE — Progress Notes (Deleted)
      HPI:   Marc Sanders is a 56 y.o. male, who is here today to follow on recent OV.   He was seen on 07/19/17. Since his last OV he was seen for acute visit due to elevated BP.  Last OV Clonazepam was started at night to help with anxiety and insomnia. Alfonso Patten was discontinued.  He is on Seroquel 100 mg at bedtime and Effexor 75 mg daily.    Review of Systems    Current Outpatient Prescriptions on File Prior to Visit  Medication Sig Dispense Refill  . atorvastatin (LIPITOR) 40 MG tablet Take 1 tablet (40 mg total) by mouth daily. 90 tablet 2  . clonazePAM (KLONOPIN) 0.5 MG tablet TAKE ONE TABLET BY MOUTH AT NIGHT 30 tablet 1  . Eszopiclone 3 MG TABS 1/2 tab daily for a week then q 2 days and stop. 30 tablet 0  . hydrochlorothiazide (HYDRODIURIL) 12.5 MG tablet Take 1 tablet (12.5 mg total) by mouth daily. 30 tablet 0  . IBU 800 MG tablet TAKE ONE TABLET BY MOUTH THREE TIMES A DAY WITH FOOD  0  . levothyroxine (SYNTHROID, LEVOTHROID) 75 MCG tablet Mondays 1.5 tab. Rest of days 1 tab. 92 tablet 2  . lisinopril-hydrochlorothiazide (PRINZIDE,ZESTORETIC) 10-12.5 MG tablet Take 1 tablet by mouth daily. 30 tablet 1  . Melatonin ER 5 MG TBCR Take 5 mg by mouth at bedtime. 90 tablet 1  . QUEtiapine (SEROQUEL) 100 MG tablet TAKE 1 TABLET (100 MG TOTAL) BY MOUTH AT BEDTIME. 90 tablet 1  . venlafaxine XR (EFFEXOR-XR) 75 MG 24 hr capsule TAKE 1 CAPSULE (75 MG TOTAL) BY MOUTH DAILY WITH BREAKFAST. 90 capsule 1   No current facility-administered medications on file prior to visit.      Past Medical History:  Diagnosis Date  . Anxiety   . Carpal tunnel syndrome of right wrist   . Depression   . Hypothyroidism    Not on File  Social History   Social History  . Marital status: Single    Spouse name: N/A  . Number of children: N/A  . Years of education: N/A   Social History Main Topics  . Smoking status: Former Smoker    Packs/day: 1.00    Years: 8.00    Types:  Cigarettes    Quit date: 12/05/1986  . Smokeless tobacco: Never Used  . Alcohol use 4.2 oz/week    7 Glasses of wine per week  . Drug use: No  . Sexual activity: Yes   Other Topics Concern  . Not on file   Social History Narrative  . No narrative on file    There were no vitals filed for this visit. There is no height or weight on file to calculate BMI.      Physical Exam    ASSESSMENT AND PLAN:     Diagnoses and all orders for this visit:  Insomnia, unspecified type  Anxiety disorder, unspecified type  Depression, major, recurrent, moderate (HCC)               Kenyah Luba G. [place], MD  [facility]. [place] office.

## 2017-09-19 ENCOUNTER — Ambulatory Visit: Payer: Managed Care, Other (non HMO) | Admitting: Family Medicine

## 2017-10-02 ENCOUNTER — Other Ambulatory Visit: Payer: Self-pay | Admitting: Family Medicine

## 2017-10-02 DIAGNOSIS — I10 Essential (primary) hypertension: Secondary | ICD-10-CM

## 2017-10-02 NOTE — Progress Notes (Signed)
HPI:   Marc Sanders is a 56 y.o. male, who is here today to follow on recent OV.   He was seen on 07/19/17. Since his last OV he was seen for acute visit due to elevated BP, 08/04/17.   Last OV Clonazepam was started at night to help with anxiety and insomnia. Alfonso PattenLunesta was discontinued. Sleeping about 6-7 hours.  He is on Seroquel 100 mg at bedtime and Effexor 75 mg daily for depression.  He denies depressed mood or suicidal thoughts.  Clonazepam has helped with anxiety greatly, he has been able to decreased beer intake. He is drinking a beer daily at night. In general he is doing "great."  His tolerating medications well and denies any side effect.  Obesity:  Dietary changes since last OV: Decreased beer intake, bread,sweets,and fried food. Exercise: None due to left knee pain, s/p arthroscopic surgery since his last OV.  Hypertension:   Currently on Lisinopril-HCTZ 10-12.5 mg daily. Lisinopril was added in 07/2017.   Last eye exam 1-2 years ago. He is taking medications as instructed, no side effects reported.  He has not noted unusual headache, visual changes, exertional chest pain, dyspnea,  focal weakness, or edema.   Lab Results  Component Value Date   CREATININE 1.01 07/19/2017   BUN 17 07/19/2017   NA 138 07/19/2017   K 4.3 07/19/2017   CL 103 07/19/2017   CO2 30 07/19/2017     Review of Systems  Constitutional: Negative for activity change, appetite change, fatigue and fever.  HENT: Negative for mouth sores, nosebleeds, sore throat and trouble swallowing.   Eyes: Negative for redness and visual disturbance.  Respiratory: Negative for cough, shortness of breath and wheezing.   Cardiovascular: Negative for chest pain, palpitations and leg swelling.  Gastrointestinal: Negative for abdominal pain, nausea and vomiting.       No changes in bowel habits.  Endocrine: Negative for cold intolerance and heat intolerance.  Genitourinary:  Negative for decreased urine volume, dysuria and hematuria.  Musculoskeletal: Positive for arthralgias and gait problem.  Skin: Negative for pallor and rash.  Neurological: Negative for dizziness, syncope, weakness and headaches.  Psychiatric/Behavioral: Negative for confusion, hallucinations, sleep disturbance and suicidal ideas. The patient is nervous/anxious.       Current Outpatient Prescriptions on File Prior to Visit  Medication Sig Dispense Refill  . atorvastatin (LIPITOR) 40 MG tablet Take 1 tablet (40 mg total) by mouth daily. 90 tablet 2  . IBU 800 MG tablet TAKE ONE TABLET BY MOUTH THREE TIMES A DAY WITH FOOD  0  . levothyroxine (SYNTHROID, LEVOTHROID) 75 MCG tablet Mondays 1.5 tab. Rest of days 1 tab. 92 tablet 2  . Melatonin ER 5 MG TBCR Take 5 mg by mouth at bedtime. 90 tablet 1  . QUEtiapine (SEROQUEL) 100 MG tablet TAKE 1 TABLET (100 MG TOTAL) BY MOUTH AT BEDTIME. 90 tablet 1  . venlafaxine XR (EFFEXOR-XR) 75 MG 24 hr capsule TAKE 1 CAPSULE (75 MG TOTAL) BY MOUTH DAILY WITH BREAKFAST. 90 capsule 1   No current facility-administered medications on file prior to visit.      Past Medical History:  Diagnosis Date  . Anxiety   . Carpal tunnel syndrome of right wrist   . Depression   . Hypothyroidism    Not on File  Social History   Social History  . Marital status: Single    Spouse name: N/A  . Number of children: N/A  . Years of  education: N/A   Social History Main Topics  . Smoking status: Former Smoker    Packs/day: 1.00    Years: 8.00    Types: Cigarettes    Quit date: 12/05/1986  . Smokeless tobacco: Never Used  . Alcohol use 4.2 oz/week    7 Glasses of wine per week  . Drug use: No  . Sexual activity: Yes   Other Topics Concern  . None   Social History Narrative  . None    Vitals:   10/03/17 1042  BP: 132/82  Pulse: 71  Resp: 12  Temp: 98.1 F (36.7 C)  SpO2: 97%   Body mass index is 34.95 kg/m.  Wt Readings from Last 3 Encounters:    10/03/17 272 lb 4 oz (123.5 kg)  08/04/17 280 lb (127 kg)  07/19/17 277 lb 8 oz (125.9 kg)     Physical Exam  Nursing note and vitals reviewed. Constitutional: He is oriented to person, place, and time. He appears well-developed. No distress.  HENT:  Head: Normocephalic and atraumatic.  Mouth/Throat: Oropharynx is clear and moist and mucous membranes are normal.  Eyes: Pupils are equal, round, and reactive to light. Conjunctivae are normal.  Cardiovascular: Normal rate and regular rhythm.   No murmur heard. Pulses:      Dorsalis pedis pulses are 2+ on the right side, and 2+ on the left side.  Respiratory: Effort normal and breath sounds normal. No respiratory distress.  GI: Soft. He exhibits no mass. There is no hepatomegaly. There is no tenderness.  Musculoskeletal: He exhibits no edema or tenderness.  Lymphadenopathy:    He has no cervical adenopathy.  Neurological: He is alert and oriented to person, place, and time. He has normal strength. Coordination normal.  Antalgic gait.  Skin: Skin is warm. No rash noted. No erythema.  Psychiatric: His mood appears anxious. Cognition and memory are normal. He expresses no suicidal ideation.  Well groomed, good eye contact.    ASSESSMENT AND PLAN:   Marc Sanders was seen today for follow-up.  Diagnoses and all orders for this visit:  Insomnia, unspecified type  Well controlled. No changes in current management. Good sleep hygiene also recommended. Follow-up in 5-6 months.  Class 1 obesity due to excess calories without serious comorbidity with body mass index (BMI) of 34.0 to 34.9 in adult  He lost about 8 pounds since 07/2017. We discussed benefits of wt loss as well as adverse effects of obesity. Encouraged consistency with healthy diet and to start physical activity as tolerated when released by ortho.   Anxiety disorder, unspecified type  Improved.  No changes in current management. Some side effects of Clonazepam  discussed. F/U in 5- 6 months,before if needed.  -     clonazePAM (KLONOPIN) 0.5 MG tablet; TAKE ONE TABLET BY MOUTH AT NIGHT  Hypertension, essential, benign  Adequately controlled. No changes in current management. DASH diet recommended. Eye exam recommended annually. F/U in 6 months, before if needed.  -     lisinopril-hydrochlorothiazide (PRINZIDE,ZESTORETIC) 10-12.5 MG tablet; Take 1 tablet by mouth daily. -     Basic metabolic panel  Depression, major, recurrent, moderate (HCC)  Stable. No changes in current management. Instructed about warning signs. A healthy diet and regular physical activity might also help with symptoms. Follow-up in 5-6 months, before if needed.   He will continue following with orthopedist for s/p left knee arthroscopic surgery.    Marc Engelbert G. [place], MD  [facility]. [place] office.

## 2017-10-03 ENCOUNTER — Ambulatory Visit (INDEPENDENT_AMBULATORY_CARE_PROVIDER_SITE_OTHER): Payer: Managed Care, Other (non HMO) | Admitting: Family Medicine

## 2017-10-03 ENCOUNTER — Encounter: Payer: Self-pay | Admitting: Family Medicine

## 2017-10-03 VITALS — BP 132/82 | HR 71 | Temp 98.1°F | Resp 12 | Ht 74.0 in | Wt 272.2 lb

## 2017-10-03 DIAGNOSIS — Z6834 Body mass index (BMI) 34.0-34.9, adult: Secondary | ICD-10-CM | POA: Diagnosis not present

## 2017-10-03 DIAGNOSIS — I10 Essential (primary) hypertension: Secondary | ICD-10-CM | POA: Diagnosis not present

## 2017-10-03 DIAGNOSIS — Z23 Encounter for immunization: Secondary | ICD-10-CM

## 2017-10-03 DIAGNOSIS — F419 Anxiety disorder, unspecified: Secondary | ICD-10-CM

## 2017-10-03 DIAGNOSIS — G4700 Insomnia, unspecified: Secondary | ICD-10-CM

## 2017-10-03 DIAGNOSIS — E6609 Other obesity due to excess calories: Secondary | ICD-10-CM

## 2017-10-03 DIAGNOSIS — F331 Major depressive disorder, recurrent, moderate: Secondary | ICD-10-CM | POA: Diagnosis not present

## 2017-10-03 LAB — BASIC METABOLIC PANEL
BUN: 23 mg/dL (ref 6–23)
CHLORIDE: 99 meq/L (ref 96–112)
CO2: 27 meq/L (ref 19–32)
Calcium: 9.6 mg/dL (ref 8.4–10.5)
Creatinine, Ser: 1.04 mg/dL (ref 0.40–1.50)
GFR: 78.78 mL/min (ref >60.00)
GLUCOSE: 101 mg/dL — AB (ref 70–99)
Potassium: 4.1 mEq/L (ref 3.5–5.1)
Sodium: 136 mEq/L (ref 135–145)

## 2017-10-03 MED ORDER — LISINOPRIL-HYDROCHLOROTHIAZIDE 10-12.5 MG PO TABS: 1.0000 | ORAL_TABLET | Freq: Every day | ORAL | 2 refills | Status: DC

## 2017-10-03 MED ORDER — CLONAZEPAM 0.5 MG PO TABS: ORAL_TABLET | ORAL | 3 refills | Status: DC

## 2017-10-03 NOTE — Patient Instructions (Signed)
A few things to remember from today's visit:   Insomnia, unspecified type  Class 1 obesity due to excess calories without serious comorbidity with body mass index (BMI) of 34.0 to 34.9 in adult  Anxiety disorder, unspecified type - Plan: clonazePAM (KLONOPIN) 0.5 MG tablet  Hypertension, essential, benign - Plan: lisinopril-hydrochlorothiazide (PRINZIDE,ZESTORETIC) 10-12.5 MG tablet, Basic metabolic panel  Depression, major, recurrent, moderate (HCC)   DASH Eating Plan DASH stands for "Dietary Approaches to Stop Hypertension." The DASH eating plan is a healthy eating plan that has been shown to reduce high blood pressure (hypertension). It may also reduce your risk for type 2 diabetes, heart disease, and stroke. The DASH eating plan may also help with weight loss. What are tips for following this plan? General guidelines  Avoid eating more than 2,300 mg (milligrams) of salt (sodium) a day. If you have hypertension, you may need to reduce your sodium intake to 1,500 mg a day.  Limit alcohol intake to no more than 1 drink a day for nonpregnant women and 2 drinks a day for men. One drink equals 12 oz of beer, 5 oz of wine, or 1 oz of hard liquor.  Work with your health care provider to maintain a healthy body weight or to lose weight. Ask what an ideal weight is for you.  Get at least 30 minutes of exercise that causes your heart to beat faster (aerobic exercise) most days of the week. Activities may include walking, swimming, or biking.  Work with your health care provider or diet and nutrition specialist (dietitian) to adjust your eating plan to your individual calorie needs. Reading food labels  Check food labels for the amount of sodium per serving. Choose foods with less than 5 percent of the Daily Value of sodium. Generally, foods with less than 300 mg of sodium per serving fit into this eating plan.  To find whole grains, look for the word "whole" as the first word in the  ingredient list. Shopping  Buy products labeled as "low-sodium" or "no salt added."  Buy fresh foods. Avoid canned foods and premade or frozen meals. Cooking  Avoid adding salt when cooking. Use salt-free seasonings or herbs instead of table salt or sea salt. Check with your health care provider or pharmacist before using salt substitutes.  Do not fry foods. Cook foods using healthy methods such as baking, boiling, grilling, and broiling instead.  Cook with heart-healthy oils, such as olive, canola, soybean, or sunflower oil. Meal planning   Eat a balanced diet that includes: ? 5 or more servings of fruits and vegetables each day. At each meal, try to fill half of your plate with fruits and vegetables. ? Up to 6-8 servings of whole grains each day. ? Less than 6 oz of lean meat, poultry, or fish each day. A 3-oz serving of meat is about the same size as a deck of cards. One egg equals 1 oz. ? 2 servings of low-fat dairy each day. ? A serving of nuts, seeds, or beans 5 times each week. ? Heart-healthy fats. Healthy fats called Omega-3 fatty acids are found in foods such as flaxseeds and coldwater fish, like sardines, salmon, and mackerel.  Limit how much you eat of the following: ? Canned or prepackaged foods. ? Food that is high in trans fat, such as fried foods. ? Food that is high in saturated fat, such as fatty meat. ? Sweets, desserts, sugary drinks, and other foods with added sugar. ? Full-fat dairy products.  Do not salt foods before eating.  Try to eat at least 2 vegetarian meals each week.  Eat more home-cooked food and less restaurant, buffet, and fast food.  When eating at a restaurant, ask that your food be prepared with less salt or no salt, if possible. What foods are recommended? The items listed may not be a complete list. Talk with your dietitian about what dietary choices are best for you. Grains Whole-grain or whole-wheat bread. Whole-grain or whole-wheat  pasta. Brown rice. Orpah Cobb. Bulgur. Whole-grain and low-sodium cereals. Pita bread. Low-fat, low-sodium crackers. Whole-wheat flour tortillas. Vegetables Fresh or frozen vegetables (raw, steamed, roasted, or grilled). Low-sodium or reduced-sodium tomato and vegetable juice. Low-sodium or reduced-sodium tomato sauce and tomato paste. Low-sodium or reduced-sodium canned vegetables. Fruits All fresh, dried, or frozen fruit. Canned fruit in natural juice (without added sugar). Meat and other protein foods Skinless chicken or [place]. Ground chicken or [place]. Pork with fat trimmed off. Fish and seafood. Egg whites. Dried beans, peas, or lentils. Unsalted nuts, nut butters, and seeds. Unsalted canned beans. Lean cuts of beef with fat trimmed off. Low-sodium, lean deli meat. Dairy Low-fat (1%) or fat-free (skim) milk. Fat-free, low-fat, or reduced-fat cheeses. Nonfat, low-sodium ricotta or cottage cheese. Low-fat or nonfat yogurt. Low-fat, low-sodium cheese. Fats and oils Soft margarine without trans fats. Vegetable oil. Low-fat, reduced-fat, or light mayonnaise and salad dressings (reduced-sodium). Canola, safflower, olive, soybean, and sunflower oils. Avocado. Seasoning and other foods Herbs. Spices. Seasoning mixes without salt. Unsalted popcorn and pretzels. Fat-free sweets. What foods are not recommended? The items listed may not be a complete list. Talk with your dietitian about what dietary choices are best for you. Grains Baked goods made with fat, such as croissants, muffins, or some breads. Dry pasta or rice meal packs. Vegetables Creamed or fried vegetables. Vegetables in a cheese sauce. Regular canned vegetables (not low-sodium or reduced-sodium). Regular canned tomato sauce and paste (not low-sodium or reduced-sodium). Regular tomato and vegetable juice (not low-sodium or reduced-sodium). Rosita Fire. Olives. Fruits Canned fruit in a light or heavy syrup. Fried fruit. Fruit in cream or  butter sauce. Meat and other protein foods Fatty cuts of meat. Ribs. Fried meat. Tomasa Blase. Sausage. Bologna and other processed lunch meats. Salami. Fatback. Hotdogs. Bratwurst. Salted nuts and seeds. Canned beans with added salt. Canned or smoked fish. Whole eggs or egg yolks. Chicken or [place] with skin. Dairy Whole or 2% milk, cream, and half-and-half. Whole or full-fat cream cheese. Whole-fat or sweetened yogurt. Full-fat cheese. Nondairy creamers. Whipped toppings. Processed cheese and cheese spreads. Fats and oils Butter. Stick margarine. Lard. Shortening. Ghee. Bacon fat. Tropical oils, such as coconut, palm kernel, or palm oil. Seasoning and other foods Salted popcorn and pretzels. Onion salt, garlic salt, seasoned salt, table salt, and sea salt. Worcestershire sauce. Tartar sauce. Barbecue sauce. Teriyaki sauce. Soy sauce, including reduced-sodium. Steak sauce. Canned and packaged gravies. Fish sauce. Oyster sauce. Cocktail sauce. Horseradish that you find on the shelf. Ketchup. Mustard. Meat flavorings and tenderizers. Bouillon cubes. Hot sauce and Tabasco sauce. Premade or packaged marinades. Premade or packaged taco seasonings. Relishes. Regular salad dressings. Where to find more information:  National Heart, Lung, and Blood Institute: PopSteam.is  American Heart Association: www.heart.org Summary  The DASH eating plan is a healthy eating plan that has been shown to reduce high blood pressure (hypertension). It may also reduce your risk for type 2 diabetes, heart disease, and stroke.  With the DASH eating plan, you should limit salt (sodium)  intake to 2,300 mg a day. If you have hypertension, you may need to reduce your sodium intake to 1,500 mg a day.  When on the DASH eating plan, aim to eat more fresh fruits and vegetables, whole grains, lean proteins, low-fat dairy, and heart-healthy fats.  Work with your health care provider or diet and nutrition specialist (dietitian) to  adjust your eating plan to your individual calorie needs. This information is not intended to replace advice given to you by your health care provider. Make sure you discuss any questions you have with your health care provider. Document Released: 11/10/2011 Document Revised: 11/14/2016 Document Reviewed: 11/14/2016 Elsevier Interactive Patient Education  2017 [employer]  Please be sure medication list is accurate. If a new problem present, please set up appointment sooner than planned today.

## 2017-10-04 ENCOUNTER — Ambulatory Visit: Payer: Managed Care, Other (non HMO) | Admitting: Urology

## 2017-10-08 ENCOUNTER — Encounter: Payer: Self-pay | Admitting: Family Medicine

## 2017-11-01 ENCOUNTER — Other Ambulatory Visit: Payer: Self-pay | Admitting: Family Medicine

## 2017-11-01 DIAGNOSIS — I10 Essential (primary) hypertension: Secondary | ICD-10-CM

## 2017-11-02 NOTE — Telephone Encounter (Signed)
Dr Jordan pt 

## 2017-11-07 ENCOUNTER — Encounter: Payer: Self-pay | Admitting: Family Medicine

## 2017-11-10 ENCOUNTER — Telehealth: Payer: Self-pay | Admitting: Family Medicine

## 2017-11-10 NOTE — Telephone Encounter (Signed)
Pt called in to check on status of below. Informed patient that Dr. SwazilandJordan is out of the office this week. Offered to send readings to another provider in PCP's absence, but pt stated he will await response from Dr. SwazilandJordan when she returns next week. Pt is completely asymptomatic w/ current BP readings. Notified patient to seek care if blurred vision, headache, etc and he agreed to instructions.

## 2017-11-10 NOTE — Telephone Encounter (Signed)
Pt  Called   Stating    He  Had   Sent  Via    E mail  To  Dr  SwazilandJordan    Attention   3  Days  Ago    About   Elevated  Blood  Pressures . He  Denies  Any  Symptoms .  He  States   He just  Wants  To  Make  Sure  Dr  Thomasene LotJordon  Is  Aware . Eber Jonesarolyn  At  Dr  Renie OraJordons    Office 3 way  conference  And   spoke  With  Patient  .  And   She  Will   followup  With   The    Correspondence .  Pt  States  That is  Fine. Pt  Advised  To  Follow  Plan of  Care  And  If  He  Has  Any  Neurological  Symptoms  Severe  Headache  And  Any  Weakness  To call  911  And  Seek immediate  Medical attention .

## 2017-11-10 NOTE — Telephone Encounter (Signed)
Pt called    Stating  He  Had   Sent  An  E  Mail   With  His   Recent  bp  To  Dr   Isabella BowensJordon   E  Mail     He  States

## 2017-11-14 ENCOUNTER — Encounter: Payer: Self-pay | Admitting: Family Medicine

## 2017-11-15 ENCOUNTER — Encounter: Payer: Self-pay | Admitting: Family Medicine

## 2017-11-15 ENCOUNTER — Other Ambulatory Visit: Payer: Self-pay | Admitting: Family Medicine

## 2017-11-15 DIAGNOSIS — I10 Essential (primary) hypertension: Secondary | ICD-10-CM

## 2017-11-15 MED ORDER — LISINOPRIL-HYDROCHLOROTHIAZIDE 10-12.5 MG PO TABS: 1.0000 | ORAL_TABLET | Freq: Two times a day (BID) | ORAL | 0 refills | Status: DC

## 2017-11-20 ENCOUNTER — Other Ambulatory Visit: Payer: Self-pay | Admitting: Family Medicine

## 2017-11-20 DIAGNOSIS — I10 Essential (primary) hypertension: Secondary | ICD-10-CM

## 2017-12-07 ENCOUNTER — Other Ambulatory Visit: Payer: Self-pay | Admitting: Family Medicine

## 2017-12-07 DIAGNOSIS — I10 Essential (primary) hypertension: Secondary | ICD-10-CM

## 2018-01-11 ENCOUNTER — Other Ambulatory Visit: Payer: Self-pay | Admitting: Family Medicine

## 2018-01-11 DIAGNOSIS — E039 Hypothyroidism, unspecified: Secondary | ICD-10-CM

## 2018-02-02 ENCOUNTER — Other Ambulatory Visit: Payer: Self-pay | Admitting: Family Medicine

## 2018-02-02 DIAGNOSIS — F339 Major depressive disorder, recurrent, unspecified: Secondary | ICD-10-CM

## 2018-02-09 ENCOUNTER — Other Ambulatory Visit: Payer: Self-pay | Admitting: Family Medicine

## 2018-02-09 DIAGNOSIS — F419 Anxiety disorder, unspecified: Secondary | ICD-10-CM

## 2018-02-09 DIAGNOSIS — F331 Major depressive disorder, recurrent, moderate: Secondary | ICD-10-CM

## 2018-02-09 DIAGNOSIS — G4700 Insomnia, unspecified: Secondary | ICD-10-CM

## 2018-02-12 NOTE — Telephone Encounter (Signed)
Pt called to f/u on RX for clonazepam. Pt is out today. Pharmacy advised him to f/u with provider. Pt requesting it be sent today.  Call back [phone]

## 2018-02-21 ENCOUNTER — Other Ambulatory Visit: Payer: Self-pay | Admitting: Family Medicine

## 2018-02-22 NOTE — Telephone Encounter (Signed)
Patient called with concerns that he is not getting refills on his medications. He states that for several months and with several of his medications he has not been getting any refills sent to the pharmacy. Per chart most of his rx's have had refills listed, so advised pt this may be a communication issue between Epic and his pharmacy.   Pt also states that he is taking double those dose of Lisinopril-HCTZ at 2 tabs daily. He states you had increased this recently. He reports this has not helped his blood pressure and he still gets reading about 10 points (both S&D) above normal range.  Dr. SwazilandJordan - Please advise if pt should continue current medication as it is not adequately controlling his BP. Thanks!

## 2018-02-23 NOTE — Telephone Encounter (Signed)
[  Reviewing my last note , he was taking Lisinopril-HCTZ 10-12.5 mg once daily (09/2017) and BP was adequate.]  Please advise to arrange F/U appt and to bring BP readings and his BP monitor with him.  Thanks, BJ

## 2018-03-20 NOTE — Progress Notes (Signed)
HPI:  Marc Sanders is a 57 y.o.male here today for his routine physical examination.  Last CPE: 03/15/17. He lives with adopted son.  Regular exercise 3 or more times per week: Not consistently because left knee pain. He had knee surgery 08/2017. Following a healthy diet: Yes,trying to do better.   Chronic medical problems: Anxiety,depression,insomnia,HTN,HLD,hypothyrodism among some.  Hx of STD's: Denies. No sexually active.   Immunization History  Administered Date(s) Administered  . Influenza,inj,Quad PF,6+ Mos 11/17/2016, 10/03/2017  . Tdap 03/15/2017    -Hep C screening: 03/2017   Last colon cancer screening: at age 57, recommend 10 years follow up. Last prostate ca screening: 3.8 in 07/2017  -Denies high alcohol intake, tobacco use, or Hx of illicit drug use. Alcohol intake: One beer in evening.   -Follow up today:    Hyperlipidemia:  Currently on Atorvastatin 40 mg daily. Following a low fat diet: Fried chicken intake decreased to q 2 weeks.  She has not noted side effects with medication.  Lab Results  Component Value Date   CHOL 164 03/15/2017   HDL 53.20 03/15/2017   LDLCALC 94 03/15/2017   TRIG 81.0 03/15/2017   CHOLHDL 3 03/15/2017    Anxiety,insomnia, and depression: Currently he is on Seroquel 100 mg at bedtime and Effexor XR 75 mg daily. Clonazepam 0.5 mg at night.   He is sleeping "fine", 8 hours. He feels rested the next day  Tolerating medication well. No depressed mood or suicidal thoughts.  He states that one of these medication is causing ED.   Hypertension:   Currently on Lisinopril-HCTZ 10-12.5 mg 2 tabs daily.   BP in his office: 140/90 after lunch He taking medications as instructed, no side effects reported.  Negative for unusual headache, visual changes, exertional chest pain, dyspnea,  focal weakness, or edema.   Lab Results  Component Value Date   CREATININE 1.04 10/03/2017   BUN 23 10/03/2017     NA 136 10/03/2017   K 4.1 10/03/2017   CL 99 10/03/2017   CO2 27 10/03/2017    Hypothyroidism:  Currently he is on Levothyroxine 75 mcg 1.5 tab at night and rest of the week 1 tab. . Tolerating medication well, no side effects reported. No changes in bowel habits, tremor, cold/heat intolerance, or abnormal weight loss.  Lab Results  Component Value Date   TSH 3.97 07/19/2017     Diarrhea intermittently for about 2 years. "Sporadic" episodes and stable. States that his father and sister have similar problem.    Review of Systems  Constitutional: Positive for fatigue. Negative for activity change, appetite change, fever and unexpected weight change.  HENT: Negative for dental problem, mouth sores, nosebleeds, sore throat, trouble swallowing and voice change.   Eyes: Negative for redness and visual disturbance.  Respiratory: Negative for cough, shortness of breath and wheezing.   Cardiovascular: Negative for chest pain, palpitations and leg swelling.  Gastrointestinal: Positive for diarrhea. Negative for abdominal pain, blood in stool, nausea and vomiting.       No changes in bowel habits.  Endocrine: Negative for cold intolerance, heat intolerance, polydipsia, polyphagia and polyuria.  Genitourinary: Negative for decreased urine volume, dysuria, genital sores, hematuria and testicular pain.  Musculoskeletal: Positive for arthralgias. Negative for gait problem and myalgias.  Skin: Negative for color change and rash.  Neurological: Negative for syncope, weakness and headaches.  Hematological: Negative for adenopathy. Does not bruise/bleed easily.  Psychiatric/Behavioral: Positive for sleep disturbance. Negative for confusion.  The patient is nervous/anxious.   All other systems reviewed and are negative.    Current Outpatient Medications on File Prior to Visit  Medication Sig Dispense Refill  . atorvastatin (LIPITOR) 40 MG tablet Take 1 tablet (40 mg total) by mouth daily.  90 tablet 2  . clonazePAM (KLONOPIN) 0.5 MG tablet TAKE ONE TABLET BY MOUTH AT NIGHT. 30 tablet 2  . IBU 800 MG tablet TAKE ONE TABLET BY MOUTH THREE TIMES A DAY WITH FOOD  0  . levothyroxine (SYNTHROID, LEVOTHROID) 75 MCG tablet TAKE 1 1/2 TABLETS ON MONDAY IN THE MORNING AND 1 TABLET THE REST OF THE DAYS 92 tablet 2  . Melatonin ER 5 MG TBCR Take 5 mg by mouth at bedtime. 90 tablet 1  . QUEtiapine (SEROQUEL) 100 MG tablet TAKE 1 TABLET (100 MG TOTAL) BY MOUTH AT BEDTIME. 90 tablet 1  . venlafaxine XR (EFFEXOR-XR) 75 MG 24 hr capsule TAKE ONE CAPSULE BY MOUTH ONCE A DAY WITH BREAKFAST 90 capsule 1   No current facility-administered medications on file prior to visit.      Past Medical History:  Diagnosis Date  . Anxiety   . Carpal tunnel syndrome of right wrist   . Depression   . Hypothyroidism     Past Surgical History:  Procedure Laterality Date  . CARPAL TUNNEL RELEASE Right 03/01/2013   Procedure: CARPAL TUNNEL RELEASE;  Surgeon: Drucilla Schmidt, MD;  Location: [facility];  Service: Orthopedics;  Laterality: Right;  . SHOULDER ARTHROSCOPY WITH OPEN ROTATOR CUFF REPAIR AND DISTAL CLAVICLE ACROMINECTOMY  12/19/2012   Procedure: SHOULDER ARTHROSCOPY WITH OPEN ROTATOR CUFF REPAIR AND DISTAL CLAVICLE ACROMINECTOMY;  Surgeon: Drucilla Schmidt, MD;  Location: [facility];  Service: Orthopedics;  Laterality: Left;  LEFT SHOULDER ARTHROSCOPY WITH LABRAL DEBRIDEMENT AND SUBACROMIAL DECOMPRESSION DISTAL CLAVICLE RESECTION AND MINI OPEN ROTATOR CUFF REPAIR WITH ANCHOR    Not on File  Family History  Problem Relation Age of Onset  . Diabetes Mother   . Mental illness Mother   . Hypertension Mother   . Mental illness Father        Alzheimer's  . Hypertension Father   . AAA (abdominal aortic aneurysm) Father   . Mental illness Sister   . AAA (abdominal aortic aneurysm) Paternal Uncle     Social History   Socioeconomic History  . Marital status: Single    Spouse name:  Not on file  . Number of children: Not on file  . Years of education: Not on file  . Highest education level: Not on file  Occupational History  . Not on file  Social Needs  . Financial resource strain: Not on file  . Food insecurity:    Worry: Not on file    Inability: Not on file  . Transportation needs:    Medical: Not on file    Non-medical: Not on file  Tobacco Use  . Smoking status: Former Smoker    Packs/day: 1.00    Years: 8.00    Pack years: 8.00    Types: Cigarettes    Last attempt to quit: 12/05/1986    Years since quitting: 31.3  . Smokeless tobacco: Never Used  Substance and Sexual Activity  . Alcohol use: Yes    Alcohol/week: 4.2 oz    Types: 7 Glasses of wine per week  . Drug use: No  . Sexual activity: Yes  Lifestyle  . Physical activity:    Days per week: Not on file  Minutes per session: Not on file  . Stress: Not on file  Relationships  . Social connections:    Talks on phone: Not on file    Gets together: Not on file    Attends religious service: Not on file    Active member of club or organization: Not on file    Attends meetings of clubs or organizations: Not on file    Relationship status: Not on file  Other Topics Concern  . Not on file  Social History Narrative  . Not on file     Vitals:   03/21/18 0801  BP: 122/70  Pulse: 81  Resp: 12  Temp: 98 F (36.7 C)  SpO2: 100%   Body mass index is 35.08 kg/m.   Wt Readings from Last 3 Encounters:  03/21/18 273 lb 4 oz (123.9 kg)  10/03/17 272 lb 4 oz (123.5 kg)  08/04/17 280 lb (127 kg)    Physical Exam  Nursing note and vitals reviewed. Constitutional: He is oriented to person, place, and time. He appears well-developed. No distress.  HENT:  Head: Normocephalic and atraumatic.  Right Ear: Tympanic membrane, external ear and ear canal normal.  Left Ear: Tympanic membrane, external ear and ear canal normal.  Mouth/Throat: Oropharynx is clear and moist and mucous membranes are  normal.  Eyes: Pupils are equal, round, and reactive to light. Conjunctivae and EOM are normal.  Neck: Normal range of motion. No tracheal deviation present. No thyromegaly present.  Cardiovascular: Normal rate and regular rhythm.  No murmur heard. Pulses:      Dorsalis pedis pulses are 2+ on the right side, and 2+ on the left side.  Respiratory: Effort normal and breath sounds normal. No respiratory distress.  GI: Soft. He exhibits no mass. There is no tenderness.  Genitourinary:  Genitourinary Comments: Deferred because no concerns.  Musculoskeletal: He exhibits no edema or tenderness.  No major deformities appreciated and no signs of synovitis. Antalgic gait.  Lymphadenopathy:    He has no cervical adenopathy.       Right: No supraclavicular adenopathy present.       Left: No supraclavicular adenopathy present.  Neurological: He is alert and oriented to person, place, and time. He has normal strength. No cranial nerve deficit or sensory deficit. Coordination and gait normal.  Patellar and bicep DTR's 1-2+ bilateral,symmetric.  Skin: Skin is warm. No rash noted. No erythema.  Psychiatric: He has a normal mood and affect. Cognition and memory are normal.  Well groomed,good eye contact.     ASSESSMENT AND PLAN:   Mr. Williard was seen today for annual exam.  Orders Placed This Encounter  Procedures  . Comprehensive metabolic panel  . Lipid panel  . TSH  . PSA(Must document that pt has been informed of limitations of PSA testing.)    Lab Results  Component Value Date   CREATININE 1.75 (H) 03/21/2018   BUN 32 (H) 03/21/2018   NA 135 03/21/2018   K 4.8 03/21/2018   CL 98 03/21/2018   CO2 30 03/21/2018   Lab Results  Component Value Date   ALT 18 03/21/2018   AST 15 03/21/2018   ALKPHOS 59 03/21/2018   BILITOT 0.5 03/21/2018    Lab Results  Component Value Date   CHOL 166 03/21/2018   HDL 53.30 03/21/2018   LDLCALC 95 03/21/2018   TRIG 87.0 03/21/2018    CHOLHDL 3 03/21/2018   Lab Results  Component Value Date   TSH 5.82 (H) 03/21/2018  Routine general medical examination at a health care facility  We discussed the importance of regular physical activity and healthy diet for prevention of chronic illness and/or complications. Preventive guidelines reviewed. Vaccination up to date. We will try to obtain copy of colonoscopy report.  Next CPE in a year.  Insomnia, unspecified type  Well controlled with current management. No changes in Seroquel. Good sleep hygiene also important. F/U in 5-6 months.  Hyperlipidemia, unspecified hyperlipidemia type  No changes in current management, will follow labs done today and will give further recommendations accordingly. F/u in 6-12 months.  -     Lipid panel  Hypothyroidism, unspecified type  No changes in current management, will follow TSH and will give further recommendations accordingly.  -     TSH  Anxiety disorder, unspecified type  Improved. Clonazepam 0.5 mg and Effexor helping. No changes in current management. F/U in 5-6 months.  Depression, major, recurrent, moderate (HCC)  Well controlled. We discussed some side effects of medications, including ED. Given the fact symptoms have improved he wants to continue current regimen. F/U in 5-6 months.  Diabetes mellitus screening -     Comprehensive metabolic panel  Hypertension, essential, benign  BP today adequately controlled. Continue monitoring BP. No changes in current management. DASH-low salt diet to continue. Eye exam recommended annually. F/U in 5-6 months, before if needed.  -     lisinopril-hydrochlorothiazide (PRINZIDE,ZESTORETIC) 20-25 MG tablet; Take 1 tablet by mouth daily.  Prostate cancer screening -     PSA(Must document that pt has been informed of limitations of PSA testing.)       Return in about 5 months (around 08/21/2018) for HTN,anx,insom.    Yuette Putnam G. [place], MD  [facility]. [place] office.

## 2018-03-21 ENCOUNTER — Ambulatory Visit (INDEPENDENT_AMBULATORY_CARE_PROVIDER_SITE_OTHER): Payer: Managed Care, Other (non HMO) | Admitting: Family Medicine

## 2018-03-21 ENCOUNTER — Encounter: Payer: Self-pay | Admitting: Family Medicine

## 2018-03-21 VITALS — BP 122/70 | HR 81 | Temp 98.0°F | Resp 12 | Ht 74.0 in | Wt 273.2 lb

## 2018-03-21 DIAGNOSIS — G4700 Insomnia, unspecified: Secondary | ICD-10-CM

## 2018-03-21 DIAGNOSIS — E039 Hypothyroidism, unspecified: Secondary | ICD-10-CM

## 2018-03-21 DIAGNOSIS — Z0000 Encounter for general adult medical examination without abnormal findings: Secondary | ICD-10-CM

## 2018-03-21 DIAGNOSIS — F331 Major depressive disorder, recurrent, moderate: Secondary | ICD-10-CM | POA: Diagnosis not present

## 2018-03-21 DIAGNOSIS — F419 Anxiety disorder, unspecified: Secondary | ICD-10-CM | POA: Diagnosis not present

## 2018-03-21 DIAGNOSIS — Z131 Encounter for screening for diabetes mellitus: Secondary | ICD-10-CM

## 2018-03-21 DIAGNOSIS — E785 Hyperlipidemia, unspecified: Secondary | ICD-10-CM | POA: Diagnosis not present

## 2018-03-21 DIAGNOSIS — I10 Essential (primary) hypertension: Secondary | ICD-10-CM

## 2018-03-21 DIAGNOSIS — Z125 Encounter for screening for malignant neoplasm of prostate: Secondary | ICD-10-CM

## 2018-03-21 LAB — COMPREHENSIVE METABOLIC PANEL
ALT: 18 U/L (ref 0–53)
AST: 15 U/L (ref 0–37)
Albumin: 4.5 g/dL (ref 3.5–5.2)
Alkaline Phosphatase: 59 U/L (ref 39–117)
BUN: 32 mg/dL — ABNORMAL HIGH (ref 6–23)
CALCIUM: 9.3 mg/dL (ref 8.4–10.5)
CO2: 30 meq/L (ref 19–32)
Chloride: 98 mEq/L (ref 96–112)
Creatinine, Ser: 1.75 mg/dL — ABNORMAL HIGH (ref 0.40–1.50)
GFR: 43.14 mL/min — AB (ref >60.00)
GLUCOSE: 103 mg/dL — AB (ref 70–99)
POTASSIUM: 4.8 meq/L (ref 3.5–5.1)
Sodium: 135 mEq/L (ref 135–145)
Total Bilirubin: 0.5 mg/dL (ref 0.2–1.2)
Total Protein: 7.5 g/dL (ref 6.0–8.3)

## 2018-03-21 LAB — LIPID PANEL
CHOL/HDL RATIO: 3
Cholesterol: 166 mg/dL (ref 0–200)
HDL: 53.3 mg/dL (ref >39.00)
LDL Cholesterol: 95 mg/dL (ref 0–99)
NONHDL: 112.86
TRIGLYCERIDES: 87 mg/dL (ref 0.0–149.0)
VLDL: 17.4 mg/dL (ref 0.0–40.0)

## 2018-03-21 LAB — TSH: TSH: 5.82 u[IU]/mL — ABNORMAL HIGH (ref 0.35–4.50)

## 2018-03-21 LAB — PSA: PSA: 0.46 ng/mL (ref 0.10–4.00)

## 2018-03-21 MED ORDER — LISINOPRIL-HYDROCHLOROTHIAZIDE 20-25 MG PO TABS: 1.0000 | ORAL_TABLET | Freq: Every day | ORAL | 2 refills | Status: DC

## 2018-03-21 NOTE — Patient Instructions (Addendum)
A few things to remember from today's visit:   Insomnia, unspecified type  Hyperlipidemia, unspecified hyperlipidemia type - Plan: Lipid panel  Hypothyroidism, unspecified type - Plan: TSH  Anxiety disorder, unspecified type  Depression, major, recurrent, moderate (HCC)  Diabetes mellitus screening - Plan: Comprehensive metabolic panel  Hypertension, essential, benign - Plan: lisinopril-hydrochlorothiazide (PRINZIDE,ZESTORETIC) 20-25 MG tablet   At least 150 minutes of moderate exercise per week, daily brisk walking for 15-30 min is a good exercise option. Healthy diet low in saturated (animal) fats and sweets and consisting of fresh fruits and vegetables, lean meats such as fish and white chicken and whole grains.  - Vaccines:  Tdap vaccine every 10 years.  Shingles vaccine recommended at age 56, could be given after 56 years of age but not sure about insurance coverage.  Pneumonia vaccines:  Prevnar 13 at 65 and Pneumovax at 6966.   -Screening recommendations for low/normal risk males:  Screening for diabetes at age 56 and every 3 years. Earlier screening if cardiovascular risk factors.     Colon cancer screening at age 56 and until age 56.  Prostate cancer screening: some controversy, starts usually at 50: Rectal exam and PSA.  Aortic Abdominal Aneurism once between 8565 and 56 years old if ever smoker.  Also recommended:  1. Dental visit- Brush and floss your teeth twice daily; visit your dentist twice a year. 2. Eye doctor- Get an eye exam at least every 2 years. 3. Helmet use- Always wear a helmet when riding a bicycle, motorcycle, rollerblading or skateboarding. 4. Safe sex- If you may be exposed to sexually transmitted infections, use a condom. 5. Seat belts- Seat belts can save your live; always wear one. 6. Smoke/Carbon Monoxide detectors- These detectors need to be installed on the appropriate level of your home. Replace batteries at least once a  year. 7. Skin cancer- When out in the sun please cover up and use sunscreen 15 SPF or higher. 8. Violence- If anyone is threatening or hurting you, please tell your healthcare provider.  9. Drink alcohol in moderation- Limit alcohol intake to one drink or less per day. Never drink and drive.   Please be sure medication list is accurate. If a new problem present, please set up appointment sooner than planned today.

## 2018-03-24 ENCOUNTER — Encounter: Payer: Self-pay | Admitting: Family Medicine

## 2018-05-08 ENCOUNTER — Other Ambulatory Visit: Payer: Self-pay | Admitting: Family Medicine

## 2018-05-10 ENCOUNTER — Other Ambulatory Visit: Payer: Self-pay | Admitting: Family Medicine

## 2018-05-10 DIAGNOSIS — F419 Anxiety disorder, unspecified: Secondary | ICD-10-CM

## 2018-05-11 NOTE — Telephone Encounter (Signed)
Last OV: 03/21/18 Last filled: 02/12/18, #30, 2 RF Sig: TAKE ONE TABLET BY MOUTH AT NIGHT

## 2018-05-14 ENCOUNTER — Encounter: Payer: Self-pay | Admitting: Family Medicine

## 2018-05-14 NOTE — Telephone Encounter (Signed)
Patient calling to check on status of clonazePAM (KLONOPIN) 0.5 MG tablet refill? Request sent by pharmacy on Wednesday 06/06.

## 2018-05-15 NOTE — Telephone Encounter (Signed)
Patient informed that Rx was sent to pharmacy. 

## 2018-08-10 ENCOUNTER — Other Ambulatory Visit: Payer: Self-pay | Admitting: Family Medicine

## 2018-08-10 DIAGNOSIS — G4700 Insomnia, unspecified: Secondary | ICD-10-CM

## 2018-08-10 DIAGNOSIS — F331 Major depressive disorder, recurrent, moderate: Secondary | ICD-10-CM

## 2018-08-10 DIAGNOSIS — F339 Major depressive disorder, recurrent, unspecified: Secondary | ICD-10-CM

## 2018-08-27 ENCOUNTER — Encounter: Payer: Self-pay | Admitting: Family Medicine

## 2018-08-28 ENCOUNTER — Encounter: Payer: Self-pay | Admitting: Family Medicine

## 2018-09-03 ENCOUNTER — Other Ambulatory Visit: Payer: Self-pay | Admitting: Family Medicine

## 2018-09-03 DIAGNOSIS — F419 Anxiety disorder, unspecified: Secondary | ICD-10-CM

## 2018-10-03 ENCOUNTER — Ambulatory Visit (INDEPENDENT_AMBULATORY_CARE_PROVIDER_SITE_OTHER): Payer: 59 | Admitting: Family Medicine

## 2018-10-03 ENCOUNTER — Encounter: Payer: Self-pay | Admitting: Family Medicine

## 2018-10-03 VITALS — BP 98/64 | HR 99 | Temp 98.1°F | Resp 12 | Ht 74.0 in | Wt 263.8 lb

## 2018-10-03 DIAGNOSIS — M1710 Unilateral primary osteoarthritis, unspecified knee: Secondary | ICD-10-CM

## 2018-10-03 DIAGNOSIS — F419 Anxiety disorder, unspecified: Secondary | ICD-10-CM | POA: Diagnosis not present

## 2018-10-03 DIAGNOSIS — G4700 Insomnia, unspecified: Secondary | ICD-10-CM | POA: Diagnosis not present

## 2018-10-03 DIAGNOSIS — M179 Osteoarthritis of knee, unspecified: Secondary | ICD-10-CM | POA: Insufficient documentation

## 2018-10-03 DIAGNOSIS — E039 Hypothyroidism, unspecified: Secondary | ICD-10-CM

## 2018-10-03 DIAGNOSIS — F331 Major depressive disorder, recurrent, moderate: Secondary | ICD-10-CM

## 2018-10-03 DIAGNOSIS — I10 Essential (primary) hypertension: Secondary | ICD-10-CM

## 2018-10-03 LAB — BASIC METABOLIC PANEL
BUN: 50 mg/dL — ABNORMAL HIGH (ref 6–23)
CHLORIDE: 100 meq/L (ref 96–112)
CO2: 26 mEq/L (ref 19–32)
Calcium: 9.6 mg/dL (ref 8.4–10.5)
Creatinine, Ser: 2.72 mg/dL — ABNORMAL HIGH (ref 0.40–1.50)
GFR: 25.88 mL/min — AB (ref >60.00)
Glucose, Bld: 121 mg/dL — ABNORMAL HIGH (ref 70–99)
POTASSIUM: 4 meq/L (ref 3.5–5.1)
Sodium: 137 mEq/L (ref 135–145)

## 2018-10-03 LAB — MICROALBUMIN / CREATININE URINE RATIO
Creatinine,U: 145.9 mg/dL
Microalb Creat Ratio: 1.1 mg/g (ref 0.0–30.0)
Microalb, Ur: 1.7 mg/dL (ref 0.0–1.9)

## 2018-10-03 LAB — TSH: TSH: 7.12 u[IU]/mL — AB (ref 0.35–4.50)

## 2018-10-03 NOTE — Assessment & Plan Note (Signed)
Overall problem is well controlled. No changes in clonazepam 0.5 mg daily. Recommend considering psychotherapy, so he can cope better with stress. Follow-up in 6 months, before if needed.

## 2018-10-03 NOTE — Progress Notes (Signed)
HPI:   [name] is a 57 y.o. male, who is here today for 6 months follow up.   He was last seen on 03/21/18   Anxiety and depression: Currently he is on Effexor XR 75 mg daily and Klonopin 0.5 mg once daily. He denies depressed mood or suicidal thoughts. He is tolerating Effexor well.    Insomnia: He is on melatonin ER 5 mg daily and Seroquel 100 mg daily. A few weeks ago he was having trouble sleeping, he thought the medication needed to be adjusted but his sleep improved after stressors at work resolved. He is sleeping between 7-10 hours. He denies side effects from medications and he does not feel drowsy next day.  Hypertension: Currently he is on lisinopril-HCTZ 20-25 mg daily. Today BP mildly low. Home BP readings: 130s/80s.   Lab Results  Component Value Date   CREATININE 1.75 (H) 03/21/2018   BUN 32 (H) 03/21/2018   NA 135 03/21/2018   K 4.8 03/21/2018   CL 98 03/21/2018   CO2 30 03/21/2018   He is still having left knee pain, he is taking ibuprofen 800 mg daily as needed.  He follows with orthopedist.  He had a fall about 2 weeks ago when he got up in the middle the night, he felt dizzy. Superficial laceration frontal right-sided and sub-conjunctival hemorrhage, he did not seek medical attention.  Laceration is healing well and subconjunctival hemorrhage has resolved.  Hypothyroidism:  On levothyroxine 75 mcg daily x6 and 1.5 tablet on Mondays. Tolerating medication well, no side effects reported. He did not have TSH done as instructed.  He has not noted dysphagia, palpitations,changes in bowel habits, tremor, cold/heat intolerance, or abnormal weight loss.  Lab Results  Component Value Date   TSH 5.82 (H) 03/21/2018     Review of Systems  Constitutional: Negative for activity change, appetite change, fatigue and fever.  HENT: Negative for nosebleeds, sore throat and trouble swallowing.   Eyes: Negative for redness and visual  disturbance.  Respiratory: Negative for cough, shortness of breath and wheezing.   Cardiovascular: Negative for chest pain, palpitations and leg swelling.  Gastrointestinal: Negative for abdominal pain, nausea and vomiting.  Endocrine: Negative for cold intolerance and heat intolerance.  Genitourinary: Negative for decreased urine volume and hematuria.  Musculoskeletal: Positive for arthralgias (Right knee) and gait problem.  Neurological: Negative for dizziness, syncope, weakness and headaches.  Psychiatric/Behavioral: Positive for sleep disturbance. Negative for confusion. The patient is nervous/anxious.      Current Outpatient Medications on File Prior to Visit  Medication Sig Dispense Refill  . atorvastatin (LIPITOR) 40 MG tablet TAKE 1 TABLET (40 MG TOTAL) BY MOUTH DAILY. 90 tablet 2  . clonazePAM (KLONOPIN) 0.5 MG tablet TAKE ONE TABLET BY MOUTH AT NIGHT. 30 tablet 0  . IBU 800 MG tablet TAKE ONE TABLET BY MOUTH THREE TIMES A DAY WITH FOOD  0  . levothyroxine (SYNTHROID, LEVOTHROID) 75 MCG tablet TAKE 1 1/2 TABLETS ON MONDAY IN THE MORNING AND 1 TABLET THE REST OF THE DAYS 92 tablet 2  . lisinopril-hydrochlorothiazide (PRINZIDE,ZESTORETIC) 20-25 MG tablet Take 1 tablet by mouth daily. 90 tablet 2  . Melatonin ER 5 MG TBCR Take 5 mg by mouth at bedtime. 90 tablet 1  . QUEtiapine (SEROQUEL) 100 MG tablet TAKE 1 TABLET (100 MG TOTAL) BY MOUTH AT BEDTIME. 90 tablet 0  . venlafaxine XR (EFFEXOR-XR) 75 MG 24 hr capsule TAKE ONE CAPSULE BY MOUTH ONCE A  DAY WITH BREAKFAST 90 capsule 0   No current facility-administered medications on file prior to visit.      Past Medical History:  Diagnosis Date  . Anxiety   . Carpal tunnel syndrome of right wrist   . Depression   . Hypothyroidism    No Known Allergies  Social History   Socioeconomic History  . Marital status: Single    Spouse name: Not on file  . Number of children: Not on file  . Years of education: Not on file  . Highest  education level: Not on file  Occupational History  . Not on file  Social Needs  . Financial resource strain: Not on file  . Food insecurity:    Worry: Not on file    Inability: Not on file  . Transportation needs:    Medical: Not on file    Non-medical: Not on file  Tobacco Use  . Smoking status: Former Smoker    Packs/day: 1.00    Years: 8.00    Pack years: 8.00    Types: Cigarettes    Last attempt to quit: 12/05/1986    Years since quitting: 31.8  . Smokeless tobacco: Never Used  Substance and Sexual Activity  . Alcohol use: Yes    Alcohol/week: 7.0 standard drinks    Types: 7 Glasses of wine per week  . Drug use: No  . Sexual activity: Yes  Lifestyle  . Physical activity:    Days per week: Not on file    Minutes per session: Not on file  . Stress: Not on file  Relationships  . Social connections:    Talks on phone: Not on file    Gets together: Not on file    Attends religious service: Not on file    Active member of club or organization: Not on file    Attends meetings of clubs or organizations: Not on file    Relationship status: Not on file  Other Topics Concern  . Not on file  Social History Narrative  . Not on file    Vitals:   10/03/18 0822  BP: 98/64  Pulse: 99  Resp: 12  Temp: 98.1 F (36.7 C)  SpO2: 96%   Body mass index is 33.87 kg/m.    Physical Exam  Nursing note reviewed. Constitutional: He is oriented to person, place, and time. He appears well-developed. No distress.  HENT:  Head: Normocephalic and atraumatic.  Mouth/Throat: Oropharynx is clear and moist and mucous membranes are normal.  Eyes: Pupils are equal, round, and reactive to light. Conjunctivae are normal.  Cardiovascular: Normal rate and regular rhythm.  No murmur heard. Pulses:      Dorsalis pedis pulses are 2+ on the right side, and 2+ on the left side.  Respiratory: Effort normal and breath sounds normal. No respiratory distress.  GI: Soft. He exhibits no mass. There  is no hepatomegaly. There is no tenderness.  Musculoskeletal: He exhibits no edema.  Lymphadenopathy:    He has no cervical adenopathy.  Neurological: He is alert and oriented to person, place, and time. He has normal strength. No cranial nerve deficit. Gait normal.  Skin: Skin is warm. No rash noted. No erythema.  Psychiatric: His mood appears anxious.  Well groomed, good eye contact.     ASSESSMENT AND PLAN:   [name] was seen today for 6 months follow-up.  Orders Placed This Encounter  Procedures  . Microalbumin / creatinine urine ratio   Lab Results  Component Value Date   MICROALBUR 1.7 10/03/2018   Lab Results  Component Value Date   CREATININE 2.72 (H) 10/03/2018   BUN 50 (H) 10/03/2018   NA 137 10/03/2018   K 4.0 10/03/2018   CL 100 10/03/2018   CO2 26 10/03/2018   Lab Results  Component Value Date   TSH 7.12 (H) 10/03/2018    OA (osteoarthritis) of knee He will continue following with orthopedics. Recommend avoiding NSAIDs and try topical icy hot or Aspercreme with lidocaine as well as Tylenol 500 mg 3-4 times per day.  Hypothyroidism No changes in current management, will follow labs done today and will give further recommendations accordingly.   Depression, major, recurrent, moderate (HCC) Problem is well controlled. No changes in Effexor XR 75 mg.  Follow-up in 6 months, before if needed.  Anxiety disorder Overall problem is well controlled. No changes in clonazepam 0.5 mg daily. Recommend considering psychotherapy, so he can cope better with stress. Follow-up in 6 months, before if needed.  Insomnia disorder Well-controlled. No changes in Seroquel 100 mg daily. Continue melatonin 5 mg at bedtime. Good sleep hygiene. Avoid alcohol around bedtime, he states that he stopped drinking beer. Follow-up in 6 months.  Hypertension, essential, benign Rechecked 95/60. Recommend monitoring BP and let me know about readings in 7 to  10 days. Adequate hydration. For now he will continue same antihypertensive medications.    Return in about 6 months (around 04/04/2019) for CPE and f/u. Fasting.     Marc Cournoyer G. [place], MD  [facility]. [place] office.

## 2018-10-03 NOTE — Patient Instructions (Addendum)
A few things to remember from today's visit:   Insomnia, unspecified type  Hypertension, essential, benign - Plan: Basic metabolic panel, Microalbumin / creatinine urine ratio  Depression, major, recurrent, moderate (HCC)  Anxiety disorder, unspecified type  Hypothyroidism, unspecified type - Plan: TSH No changes today. Blood pressure reading in 7-10 days. Adequate hydration. Avoid Ibuprofen.  Tylenol 500 mg 3-4 times per day.    Please be sure medication list is accurate. If a new problem present, please set up appointment sooner than planned today.

## 2018-10-03 NOTE — Assessment & Plan Note (Signed)
No changes in current management, will follow labs done today and will give further recommendations accordingly.  

## 2018-10-03 NOTE — Assessment & Plan Note (Signed)
He will continue following with orthopedics. Recommend avoiding NSAIDs and try topical icy hot or Aspercreme with lidocaine as well as Tylenol 500 mg 3-4 times per day.

## 2018-10-03 NOTE — Assessment & Plan Note (Signed)
Well-controlled. No changes in Seroquel 100 mg daily. Continue melatonin 5 mg at bedtime. Good sleep hygiene. Avoid alcohol around bedtime, he states that he stopped drinking beer. Follow-up in 6 months.

## 2018-10-03 NOTE — Assessment & Plan Note (Signed)
Rechecked 95/60. Recommend monitoring BP and let me know about readings in 7 to 10 days. Adequate hydration. For now he will continue same antihypertensive medications.

## 2018-10-03 NOTE — Assessment & Plan Note (Signed)
Problem is well controlled. No changes in Effexor XR 75 mg.  Follow-up in 6 months, before if needed.

## 2018-10-05 ENCOUNTER — Other Ambulatory Visit: Payer: Self-pay | Admitting: Family Medicine

## 2018-10-05 DIAGNOSIS — F419 Anxiety disorder, unspecified: Secondary | ICD-10-CM

## 2018-10-05 DIAGNOSIS — E039 Hypothyroidism, unspecified: Secondary | ICD-10-CM

## 2018-10-07 ENCOUNTER — Encounter: Payer: Self-pay | Admitting: Family Medicine

## 2018-10-17 ENCOUNTER — Other Ambulatory Visit (INDEPENDENT_AMBULATORY_CARE_PROVIDER_SITE_OTHER): Payer: 59

## 2018-10-17 ENCOUNTER — Telehealth: Payer: Self-pay | Admitting: Family Medicine

## 2018-10-17 DIAGNOSIS — I10 Essential (primary) hypertension: Secondary | ICD-10-CM

## 2018-10-17 DIAGNOSIS — E039 Hypothyroidism, unspecified: Secondary | ICD-10-CM

## 2018-10-17 LAB — BASIC METABOLIC PANEL
BUN: 45 mg/dL — ABNORMAL HIGH (ref 6–23)
CHLORIDE: 98 meq/L (ref 96–112)
CO2: 27 meq/L (ref 19–32)
CREATININE: 3.19 mg/dL — AB (ref 0.40–1.50)
Calcium: 9.2 mg/dL (ref 8.4–10.5)
GFR: 21.53 mL/min — ABNORMAL LOW (ref >60.00)
Glucose, Bld: 118 mg/dL — ABNORMAL HIGH (ref 70–99)
Potassium: 4.1 mEq/L (ref 3.5–5.1)
Sodium: 136 mEq/L (ref 135–145)

## 2018-10-17 LAB — TSH: TSH: 6.65 u[IU]/mL — AB (ref 0.35–4.50)

## 2018-10-17 NOTE — Telephone Encounter (Signed)
Pt is here in the office and brought in his BP reading and they are as following:  10/04/18  126/76 11/1   127/79 11/4   114/75 11/5  113/74 11/6 131/74 11/7  151/97 11/8   142/98   156/100  208/132 (pt state that at this reading he took 2 linsinopril and now he has leveled out and is taking only one a day).

## 2018-10-17 NOTE — Telephone Encounter (Signed)
Message sent to Dr. Jordan for review. 

## 2018-10-19 NOTE — Telephone Encounter (Signed)
Most BP readings are adequate. The last 2 days BP was elevated. He could continue monitoring BP and if frequently he has BPs as he did 11/6 and 11/7 will adjust medication OR if he wants he could increase lisinopril dose now from 20 mg to 40 mg (lisinopril 20-12.52 tablets daily).  Thanks, BJ

## 2018-10-19 NOTE — Telephone Encounter (Signed)
Spoke with patient and he stated that since taking the lisinopril like he was before, his B/P readings are back to normal. Patient given instructions per Dr. [place] and verbalized understanding.

## 2018-10-22 ENCOUNTER — Other Ambulatory Visit: Payer: Self-pay | Admitting: Family Medicine

## 2018-10-22 DIAGNOSIS — N179 Acute kidney failure, unspecified: Secondary | ICD-10-CM

## 2018-10-22 MED ORDER — AMLODIPINE BESYLATE 10 MG PO TABS: 10.0000 mg | ORAL_TABLET | Freq: Every day | ORAL | 0 refills | Status: DC

## 2018-10-22 NOTE — Progress Notes (Signed)
Spoke with pt and discussed lab results.  Recently he sent some BP readings, they were elevated so I recommended doubling BP medication without realizing he was already  instructed a few days before to stop lisinopril-HCTZ due to elevated Cr.  Lab Results  Component Value Date   CREATININE 3.19 (H) 10/17/2018   BUN 45 (H) 10/17/2018   NA 136 10/17/2018   K 4.1 10/17/2018   CL 98 10/17/2018   CO2 27 10/17/2018    He already discontinued ibuprofen.  He double Lisinopril x 2 days ago and BP readings improved: 124/77, 106/63, 133/75, 133/70, 123/80, 139/87.  Instructed patient to discontinue lisinopril-HCTZ. He will start amlodipine 10 mg daily.  Instructed to continue monitoring BP, increased fluid intake, and monitor for warning signs. He will be back to repeat BMP on 10/24/2018.  Nephrology referral was placed. Renal US Dupplex will be arranged.  Patient voiced understanding and repeated instructions back to me.    [place]. [place]rologist, who kindly agreed to discuss patient's problem. She agrees with plan and will try to bring patient to her office as soon as possible.   Tessia Kassin [place], MD

## 2018-10-24 ENCOUNTER — Encounter: Payer: Self-pay | Admitting: Family Medicine

## 2018-10-24 ENCOUNTER — Other Ambulatory Visit (INDEPENDENT_AMBULATORY_CARE_PROVIDER_SITE_OTHER): Payer: 59

## 2018-10-24 DIAGNOSIS — N179 Acute kidney failure, unspecified: Secondary | ICD-10-CM

## 2018-10-24 LAB — CBC
HEMATOCRIT: 36.1 % — AB (ref 39.0–52.0)
HEMOGLOBIN: 12.3 g/dL — AB (ref 13.0–17.0)
MCHC: 34.1 g/dL (ref 30.0–36.0)
MCV: 102 fl — AB (ref 78.0–100.0)
PLATELETS: 184 10*3/uL (ref 150.0–400.0)
RBC: 3.54 Mil/uL — ABNORMAL LOW (ref 4.22–5.81)
RDW: 13.8 % (ref 11.5–15.5)
WBC: 3.9 10*3/uL — ABNORMAL LOW (ref 4.0–10.5)

## 2018-10-24 LAB — BASIC METABOLIC PANEL
BUN: 53 mg/dL — AB (ref 6–23)
CHLORIDE: 98 meq/L (ref 96–112)
CO2: 28 meq/L (ref 19–32)
CREATININE: 2.93 mg/dL — AB (ref 0.40–1.50)
Calcium: 9.4 mg/dL (ref 8.4–10.5)
GFR: 23.75 mL/min — ABNORMAL LOW (ref >60.00)
GLUCOSE: 106 mg/dL — AB (ref 70–99)
Potassium: 4.3 mEq/L (ref 3.5–5.1)
Sodium: 135 mEq/L (ref 135–145)

## 2018-10-24 LAB — MICROALBUMIN / CREATININE URINE RATIO
CREATININE, U: 193.1 mg/dL
Microalb Creat Ratio: 0.6 mg/g (ref 0.0–30.0)
Microalb, Ur: 1.2 mg/dL (ref 0.0–1.9)

## 2018-10-31 ENCOUNTER — Other Ambulatory Visit (INDEPENDENT_AMBULATORY_CARE_PROVIDER_SITE_OTHER): Payer: 59

## 2018-10-31 ENCOUNTER — Other Ambulatory Visit: Payer: Self-pay | Admitting: Family Medicine

## 2018-10-31 DIAGNOSIS — N179 Acute kidney failure, unspecified: Secondary | ICD-10-CM

## 2018-10-31 DIAGNOSIS — G4700 Insomnia, unspecified: Secondary | ICD-10-CM

## 2018-10-31 DIAGNOSIS — F331 Major depressive disorder, recurrent, moderate: Secondary | ICD-10-CM

## 2018-10-31 LAB — BASIC METABOLIC PANEL
BUN: 21 mg/dL (ref 6–23)
CALCIUM: 9.1 mg/dL (ref 8.4–10.5)
CO2: 26 mEq/L (ref 19–32)
CREATININE: 1.59 mg/dL — AB (ref 0.40–1.50)
Chloride: 106 mEq/L (ref 96–112)
GFR: 48.08 mL/min — ABNORMAL LOW (ref >60.00)
Glucose, Bld: 85 mg/dL (ref 70–99)
Potassium: 4.1 mEq/L (ref 3.5–5.1)
SODIUM: 138 meq/L (ref 135–145)

## 2018-11-05 ENCOUNTER — Telehealth: Payer: Self-pay | Admitting: Family Medicine

## 2018-11-05 ENCOUNTER — Encounter: Payer: Self-pay | Admitting: Family Medicine

## 2018-11-05 ENCOUNTER — Other Ambulatory Visit: Payer: Self-pay | Admitting: Family Medicine

## 2018-11-05 DIAGNOSIS — F339 Major depressive disorder, recurrent, unspecified: Secondary | ICD-10-CM

## 2018-11-05 NOTE — Telephone Encounter (Signed)
Copied from CRM [phone]. Topic: General - Other >> Nov 05, 2018 12:17 PM Gerrianne ScalePayne, Nikolas Casher L wrote: Reason for CRM: pt calling for lab results

## 2018-11-05 NOTE — Telephone Encounter (Signed)
Sent patient a Clinical cytogeneticistmychart message informing him of office being closed Thursday and Friday due to holiday and that as soon as PCP views labs and make recommendations, we would give him a call with results.

## 2018-11-06 ENCOUNTER — Encounter: Payer: Self-pay | Admitting: Family Medicine

## 2018-11-06 NOTE — Telephone Encounter (Signed)
Patient given lab results through mychart.

## 2018-11-08 ENCOUNTER — Telehealth: Payer: Self-pay

## 2018-11-08 NOTE — Telephone Encounter (Signed)
Copied from CRM [phone]. Topic: Referral - Status >> Nov 08, 2018 11:46 AM Terisa Starraylor, Brittany L wrote: Reason for CRM: Annabelle Harmanana with [facility] called and said that he called and canceled his appointment for tomorrow due to his numbers being better. She said that he was marked urgent and is concerned. She can be reached at [phone]

## 2018-11-13 NOTE — Telephone Encounter (Signed)
FYI sent to Dr. [place]Jordan. Spoke with patient through my chart and he stated that he canceled his appointment because his numbers from his last test results were normal.

## 2018-11-14 NOTE — Telephone Encounter (Signed)
I still would like nephrologist evaluation. We can discuss this and repeat labs during his 4 weeks follow up,[which I do not see been scheduled.]  Thanks, BJ

## 2018-11-16 NOTE — Telephone Encounter (Signed)
Spoke with patient and gave directions per Dr. [place]. Patient verbalized understanding. Patient scheduled follow-up for 12/12/18 at 11 am.

## 2018-11-21 ENCOUNTER — Encounter: Payer: Self-pay | Admitting: Family Medicine

## 2018-11-22 ENCOUNTER — Encounter: Payer: Self-pay | Admitting: Family Medicine

## 2018-11-23 ENCOUNTER — Other Ambulatory Visit: Payer: Self-pay | Admitting: Family Medicine

## 2018-11-23 MED ORDER — HYDROCHLOROTHIAZIDE 25 MG PO TABS: 25.0000 mg | ORAL_TABLET | Freq: Every day | ORAL | 1 refills | Status: DC

## 2018-12-12 ENCOUNTER — Ambulatory Visit: Payer: 59 | Admitting: Family Medicine

## 2018-12-12 ENCOUNTER — Encounter: Payer: Self-pay | Admitting: Family Medicine

## 2018-12-12 VITALS — BP 150/80 | HR 99 | Temp 98.7°F | Resp 12 | Ht 74.0 in | Wt 272.0 lb

## 2018-12-12 DIAGNOSIS — F419 Anxiety disorder, unspecified: Secondary | ICD-10-CM

## 2018-12-12 DIAGNOSIS — N179 Acute kidney failure, unspecified: Secondary | ICD-10-CM | POA: Diagnosis not present

## 2018-12-12 DIAGNOSIS — I10 Essential (primary) hypertension: Secondary | ICD-10-CM

## 2018-12-12 DIAGNOSIS — R600 Localized edema: Secondary | ICD-10-CM

## 2018-12-12 LAB — BASIC METABOLIC PANEL
BUN: 17 mg/dL (ref 6–23)
CALCIUM: 9.2 mg/dL (ref 8.4–10.5)
CO2: 30 mEq/L (ref 19–32)
CREATININE: 1.27 mg/dL (ref 0.40–1.50)
Chloride: 99 mEq/L (ref 96–112)
GFR: 62.29 mL/min (ref >60.00)
GLUCOSE: 104 mg/dL — AB (ref 70–99)
Potassium: 3.3 mEq/L — ABNORMAL LOW (ref 3.5–5.1)
Sodium: 138 mEq/L (ref 135–145)

## 2018-12-12 MED ORDER — PROPRANOLOL HCL 20 MG PO TABS: 20.0000 mg | ORAL_TABLET | Freq: Two times a day (BID) | ORAL | 2 refills | Status: DC

## 2018-12-12 NOTE — Assessment & Plan Note (Signed)
Not well controlled. Possible complications of elevated BP discussed. Propranolol 20 mg twice daily added today (may also help with anxiety). No changes in amlodipine or HCTZ. Follow-up in 3 months, before if needed.

## 2018-12-12 NOTE — Progress Notes (Signed)
Marc. Marc Sanders is a 58 y.o.male, who is here today to follow on HTN and AKI. Last follow up visit: 10/03/2017. On 10/07/2018 noted abnormal renal function test, which has been slowly getting better after adjusting BP medications and stopping NSAIDs + Lisinopril.  Currently on amlodipine 10 mg daily and HCTZ 25 mg daily. He is taking medications as instructed, no side effects reported. He denies high alcohol intake, drinking 1 beer daily.  He has not noted unusual headache, visual changes, exertional chest pain, dyspnea, or focal weakness. Home BP readings: 150-160s/80s-90s.   Anxiety has been stable. He is on Clonazepam and Effexor. No new stressors.  Lab Results  Component Value Date   CREATININE 1.59 (H) 10/31/2018   BUN 21 10/31/2018   NA 138 10/31/2018   K 4.1 10/31/2018   CL 106 10/31/2018   CO2 26 10/31/2018   He was referred to nephrologist, he canceled appointment. Renal artery US was not done.  Also concerned about lower extremity edema, which has improved some. He denies local erythema or pain, has tightness sensation.  He was seen in an acute care facility, labs were don and according to pt,Cr was normal.  First thing in the morning edema is not present, exacerbated by prolonged standing/walking. It is worse at the end of the day. He has not noted foam in urine,gross hematuria,or decreased urineoputput. Negative for orthopnea or PND.    Review of Systems  Constitutional: Negative for activity change, appetite change, fatigue and fever.  HENT: Negative for nosebleeds, sore throat and trouble swallowing.   Eyes: Negative for redness and visual disturbance.  Respiratory: Negative for cough, shortness of breath and wheezing.   Cardiovascular: Positive for leg swelling. Negative for chest pain and palpitations.  Gastrointestinal: Negative for abdominal pain, nausea and vomiting.  Genitourinary: Negative for decreased urine volume, dysuria and  hematuria.  Musculoskeletal: Negative for gait problem and myalgias.  Skin: Negative for rash and wound.  Neurological: Negative for syncope, weakness and headaches.  Psychiatric/Behavioral: Positive for sleep disturbance. Negative for confusion. The patient is nervous/anxious.      Current Outpatient Medications on File Prior to Visit  Medication Sig Dispense Refill  . amLODipine (NORVASC) 10 MG tablet Take 1 tablet (10 mg total) by mouth daily. 90 tablet 0  . atorvastatin (LIPITOR) 40 MG tablet TAKE 1 TABLET (40 MG TOTAL) BY MOUTH DAILY. 90 tablet 2  . clonazePAM (KLONOPIN) 0.5 MG tablet TAKE ONE TABLET BY MOUTH AT NIGHT. 30 tablet 3  . hydrochlorothiazide (HYDRODIURIL) 25 MG tablet Take 1 tablet (25 mg total) by mouth daily. 30 tablet 1  . levothyroxine (SYNTHROID, LEVOTHROID) 75 MCG tablet TAKE 1 1/2 TABLETS ON MONDAY IN THE MORNING AND 1 TABLET THE REST OF THE DAYS 92 tablet 3  . Melatonin ER 5 MG TBCR Take 5 mg by mouth at bedtime. 90 tablet 1  . QUEtiapine (SEROQUEL) 100 MG tablet TAKE 1 TABLET (100 MG TOTAL) BY MOUTH AT BEDTIME. 90 tablet 1  . venlafaxine XR (EFFEXOR-XR) 75 MG 24 hr capsule TAKE ONE CAPSULE BY MOUTH ONCE A DAY WITH BREAKFAST 90 capsule 1   No current facility-administered medications on file prior to visit.      Past Medical History:  Diagnosis Date  . Anxiety   . Carpal tunnel syndrome of right wrist   . Depression   . Hypothyroidism     No Known Allergies  Social History   Socioeconomic History  . Marital status: Single  Spouse name: Not on file  . Number of children: Not on file  . Years of education: Not on file  . Highest education level: Not on file  Occupational History  . Not on file  Social Needs  . Financial resource strain: Not on file  . Food insecurity:    Worry: Not on file    Inability: Not on file  . Transportation needs:    Medical: Not on file    Non-medical: Not on file  Tobacco Use  . Smoking status: Former Smoker     Packs/day: 1.00    Years: 8.00    Pack years: 8.00    Types: Cigarettes    Last attempt to quit: 12/05/1986    Years since quitting: 32.0  . Smokeless tobacco: Never Used  Substance and Sexual Activity  . Alcohol use: Yes    Alcohol/week: 7.0 standard drinks    Types: 7 Glasses of wine per week  . Drug use: No  . Sexual activity: Yes  Lifestyle  . Physical activity:    Days per week: Not on file    Minutes per session: Not on file  . Stress: Not on file  Relationships  . Social connections:    Talks on phone: Not on file    Gets together: Not on file    Attends religious service: Not on file    Active member of club or organization: Not on file    Attends meetings of clubs or organizations: Not on file    Relationship status: Not on file  Other Topics Concern  . Not on file  Social History Narrative  . Not on file    Vitals:   12/12/18 1103 12/12/18 1135  BP: 140/90 (!) 150/80  Pulse: 99   Resp: 12   Temp: 98.7 F (37.1 C)   SpO2: 98%    Body mass index is 34.92 kg/m.   Physical Exam  Nursing note and vitals reviewed. Constitutional: He is oriented to person, place, and time. He appears well-developed. No distress.  HENT:  Head: Normocephalic and atraumatic.  Mouth/Throat: Oropharynx is clear and moist and mucous membranes are normal.  Eyes: Pupils are equal, round, and reactive to light. Conjunctivae are normal.  Cardiovascular: Normal rate and regular rhythm.  No murmur heard. Pulses:      Dorsalis pedis pulses are 2+ on the right side and 2+ on the left side.  Respiratory: Effort normal and breath sounds normal. No respiratory distress.  GI: Soft. He exhibits no mass. There is no hepatomegaly. There is no abdominal tenderness.  Musculoskeletal:        General: Edema (2+ pitting LE edema, bilateral.) present.  Lymphadenopathy:    He has no cervical adenopathy.  Neurological: He is alert and oriented to person, place, and time. He has normal strength. No  cranial nerve deficit. Gait normal.  Skin: Skin is warm. No rash noted. No erythema.  Psychiatric: His mood appears anxious.  Well groomed, good eye contact.    ASSESSMENT AND PLAN:   Marc Sanders was seen today for follow-up.  Orders Placed This Encounter  Procedures  . Basic metabolic panel  . Ambulatory referral to Nephrology   Lab Results  Component Value Date   CREATININE 1.27 12/12/2018   BUN 17 12/12/2018   NA 138 12/12/2018   K 3.3 (L) 12/12/2018   CL 99 12/12/2018   CO2 30 12/12/2018     Bilateral lower extremity edema We discussed possible etiologies including medications,  hypertension, venous,and renal disease among some. Continue LE elevation. Instructed about warning signs.  AKI (acute kidney injury) (HCC) Renal function slowly improving. I still think it is important to have nephrology consultation.  Adequate hydration. Avoid NSAID's. Continue low salt diet.  -     Basic metabolic panel -     Ambulatory referral to Nephrology   Hypertension, essential, benign Not well controlled. Possible complications of elevated BP discussed. Propranolol 20 mg twice daily added today (may also help with anxiety). No changes in amlodipine or HCTZ. Follow-up in 3 months, before if needed.   Anxiety disorder, unspecified type No changes in Clonazepam or Effexor. Propranolol added today may also help with problem. F/U in 3 months.     Khaleah Duer G. [place], MD  [facility]. [place] office.

## 2018-12-12 NOTE — Patient Instructions (Signed)
A few things to remember from today's visit:   Hypertension, essential, benign - Plan: Basic metabolic panel, propranolol (INDERAL) 20 MG tablet  Bilateral lower extremity edema  CKD (chronic kidney disease), stage III (HCC) - Plan: Basic metabolic panel, Ambulatory referral to Nephrology  Continue monitoring blood pressure at home. Today propranolol 20 mg twice daily left-sided. No changes in rest of your medications. Continue low-salt diet. To preserve kidney function and/or slow progression of disease:  Low salt diet and adequate hydration. Avoiding medicines known as "nonsteroidal anti-inflammatory drugs," or NSAIDs. These medicines include ibuprofen (sample brand names: Advil, Motrin) and naproxen (sample brand name: Aleve).  Adequate blood pressure control. Low phosphorus diet.   Please be sure medication list is accurate. If a new problem present, please set up appointment sooner than planned today.

## 2018-12-13 ENCOUNTER — Encounter: Payer: Self-pay | Admitting: Family Medicine

## 2019-01-03 ENCOUNTER — Other Ambulatory Visit: Payer: Self-pay | Admitting: Family Medicine

## 2019-01-24 ENCOUNTER — Other Ambulatory Visit: Payer: Self-pay | Admitting: Family Medicine

## 2019-01-24 MED ORDER — HYDROCHLOROTHIAZIDE 25 MG PO TABS: 25.0000 mg | ORAL_TABLET | Freq: Every day | ORAL | 0 refills | Status: DC

## 2019-01-24 MED ORDER — AMLODIPINE BESYLATE 10 MG PO TABS: 10.0000 mg | ORAL_TABLET | Freq: Every day | ORAL | 0 refills | Status: DC

## 2019-01-24 NOTE — Telephone Encounter (Signed)
Copied from CRM [id number]. Topic: Quick Communication - Rx Refill/Question >> Jan 24, 2019  9:06 AM Angela Nevin wrote: Medication: hydrochlorothiazide (HYDRODIURIL) 25 MG tablet and amLODipine (NORVASC) 10 MG tablet  Has the patient contacted their pharmacy? Yes, advised to contact office as multiple requests have been sent with no response.   Preferred Pharmacy (with phone number or street name):[facility] - [place], [place] - [address] [phone] (Phone) [phone] (Fax)

## 2019-01-28 ENCOUNTER — Other Ambulatory Visit: Payer: Self-pay | Admitting: Family Medicine

## 2019-01-28 DIAGNOSIS — F419 Anxiety disorder, unspecified: Secondary | ICD-10-CM

## 2019-03-11 ENCOUNTER — Other Ambulatory Visit: Payer: Self-pay | Admitting: Family Medicine

## 2019-03-11 DIAGNOSIS — I10 Essential (primary) hypertension: Secondary | ICD-10-CM

## 2019-03-13 ENCOUNTER — Other Ambulatory Visit: Payer: Self-pay

## 2019-03-13 ENCOUNTER — Ambulatory Visit (INDEPENDENT_AMBULATORY_CARE_PROVIDER_SITE_OTHER): Payer: 59 | Admitting: Family Medicine

## 2019-03-13 ENCOUNTER — Encounter: Payer: Self-pay | Admitting: Family Medicine

## 2019-03-13 VITALS — BP 143/90 | HR 84 | Resp 12

## 2019-03-13 DIAGNOSIS — Z6835 Body mass index (BMI) 35.0-35.9, adult: Secondary | ICD-10-CM

## 2019-03-13 DIAGNOSIS — G4700 Insomnia, unspecified: Secondary | ICD-10-CM | POA: Diagnosis not present

## 2019-03-13 DIAGNOSIS — F419 Anxiety disorder, unspecified: Secondary | ICD-10-CM | POA: Diagnosis not present

## 2019-03-13 DIAGNOSIS — E876 Hypokalemia: Secondary | ICD-10-CM

## 2019-03-13 DIAGNOSIS — F331 Major depressive disorder, recurrent, moderate: Secondary | ICD-10-CM

## 2019-03-13 DIAGNOSIS — I10 Essential (primary) hypertension: Secondary | ICD-10-CM | POA: Diagnosis not present

## 2019-03-13 MED ORDER — LISINOPRIL-HYDROCHLOROTHIAZIDE 10-12.5 MG PO TABS: 1.0000 | ORAL_TABLET | Freq: Every day | ORAL | 0 refills | Status: DC

## 2019-03-13 NOTE — Assessment & Plan Note (Signed)
Not well controlled. Possible complications of elevated BP discussed. He would like to take lisinopril again, we discussed some side effects.  Given the fact his renal function is back to normal, we can try lisinopril again. He has lisinopril-HCTZ 10-12.5 mg at home, so he will start taking it. Stop HCTZ 25 mg. No changes in propranolol 20 mg twice daily or amlodipine 10 mg daily. BMP check in 2-3 weeks and f/u in 3 months.

## 2019-03-13 NOTE — Progress Notes (Signed)
Virtual Visit via Video Note   I connected with [name] on 03/13/19 at 11:30 AM EDT by a video enabled telemedicine application and verified that I am speaking with the correct person using two identifiers.  Location patient: home Location provider:work or home office Persons participating in the virtual visit: patient, provider  I discussed the limitations of evaluation and management by telemedicine and the availability of in person appointments. The patient expressed understanding and agreed to proceed.   HPI: [name] was last seen on 12/12/2018 when BP was elevated,so Propranolol 20 mg bid was added. He is also on HCTZ 25 mg and Amlodipine 10 mg daily.  BP is still elevated, 140-150/90's-100 Denies severe/frequent headache, visual changes, chest pain, dyspnea, palpitation,focal weakness, or edema. LE edema has improved,it feels "little tight." Denies pain or erythema.  Lisinopril 10 mg was discontinued early this year because AKI. Mild hypoK+,he has not noted muscle cramps.  Lab Results  Component Value Date   CREATININE 1.27 12/12/2018   BUN 17 12/12/2018   NA 138 12/12/2018   K 3.3 (L) 12/12/2018   CL 99 12/12/2018   CO2 30 12/12/2018   Anxiety and depression, currently on Effexor XR 75 mg daily and Clonazepam 0.5 mg daily. Denies depressed mood or suicidal thoughts.  Insomnia,he is taking Seroquel 100 mg daily and Melatonin ER 5 mg daily. Sleeping about 6-7 hours.  Tolerating meds well.  He is trying to eat healthful, has lost some wt. 2 weeks ago he started Keto diet. He is not exercising regularly.  ROS: See pertinent positives and negatives per HPI.  Past Medical History:  Diagnosis Date  . Anxiety   . Carpal tunnel syndrome of right wrist   . Depression   . Hypothyroidism     Past Surgical History:  Procedure Laterality Date  . CARPAL TUNNEL RELEASE Right 03/01/2013   Procedure: CARPAL TUNNEL RELEASE;  Surgeon: Drucilla SchmidtJames P Aplington, MD;  Location:  [facility];  Service: Orthopedics;  Laterality: Right;  . SHOULDER ARTHROSCOPY WITH OPEN ROTATOR CUFF REPAIR AND DISTAL CLAVICLE ACROMINECTOMY  12/19/2012   Procedure: SHOULDER ARTHROSCOPY WITH OPEN ROTATOR CUFF REPAIR AND DISTAL CLAVICLE ACROMINECTOMY;  Surgeon: Drucilla SchmidtJames P Aplington, MD;  Location: [facility];  Service: Orthopedics;  Laterality: Left;  LEFT SHOULDER ARTHROSCOPY WITH LABRAL DEBRIDEMENT AND SUBACROMIAL DECOMPRESSION DISTAL CLAVICLE RESECTION AND MINI OPEN ROTATOR CUFF REPAIR WITH ANCHOR    Family History  Problem Relation Age of Onset  . Diabetes Mother   . Mental illness Mother   . Hypertension Mother   . Mental illness Father        Alzheimer's  . Hypertension Father   . AAA (abdominal aortic aneurysm) Father   . Mental illness Sister   . AAA (abdominal aortic aneurysm) Paternal Uncle     Social History   Socioeconomic History  . Marital status: Single    Spouse name: Not on file  . Number of children: Not on file  . Years of education: Not on file  . Highest education level: Not on file  Occupational History  . Not on file  Social Needs  . Financial resource strain: Not on file  . Food insecurity:    Worry: Not on file    Inability: Not on file  . Transportation needs:    Medical: Not on file    Non-medical: Not on file  Tobacco Use  . Smoking status: Former Smoker    Packs/day: 1.00    Years: 8.00  Pack years: 8.00    Types: Cigarettes    Last attempt to quit: 12/05/1986    Years since quitting: 32.2  . Smokeless tobacco: Never Used  Substance and Sexual Activity  . Alcohol use: Yes    Alcohol/week: 7.0 standard drinks    Types: 7 Glasses of wine per week  . Drug use: No  . Sexual activity: Yes  Lifestyle  . Physical activity:    Days per week: Not on file    Minutes per session: Not on file  . Stress: Not on file  Relationships  . Social connections:    Talks on phone: Not on file    Gets together: Not on file    Attends  religious service: Not on file    Active member of club or organization: Not on file    Attends meetings of clubs or organizations: Not on file    Relationship status: Not on file  . Intimate partner violence:    Fear of current or ex partner: Not on file    Emotionally abused: Not on file    Physically abused: Not on file    Forced sexual activity: Not on file  Other Topics Concern  . Not on file  Social History Narrative  . Not on file      Current Outpatient Medications:  .  amLODipine (NORVASC) 10 MG tablet, Take 1 tablet (10 mg total) by mouth daily., Disp: 90 tablet, Rfl: 0 .  atorvastatin (LIPITOR) 40 MG tablet, TAKE 1 TABLET (40 MG TOTAL) BY MOUTH DAILY., Disp: 90 tablet, Rfl: 2 .  clonazePAM (KLONOPIN) 0.5 MG tablet, TAKE ONE TABLET BY MOUTH AT NIGHT., Disp: 30 tablet, Rfl: 1 .  levothyroxine (SYNTHROID, LEVOTHROID) 75 MCG tablet, TAKE 1 1/2 TABLETS ON MONDAY IN THE MORNING AND 1 TABLET THE REST OF THE DAYS, Disp: 92 tablet, Rfl: 3 .  lisinopril-hydrochlorothiazide (PRINZIDE,ZESTORETIC) 10-12.5 MG tablet, Take 1 tablet by mouth daily., Disp: 90 tablet, Rfl: 0 .  Melatonin ER 5 MG TBCR, Take 5 mg by mouth at bedtime., Disp: 90 tablet, Rfl: 1 .  propranolol (INDERAL) 20 MG tablet, TAKE 1 TABLET (20 MG TOTAL) BY MOUTH 2 (TWO) TIMES DAILY., Disp: 60 tablet, Rfl: 2 .  QUEtiapine (SEROQUEL) 100 MG tablet, TAKE 1 TABLET (100 MG TOTAL) BY MOUTH AT BEDTIME., Disp: 90 tablet, Rfl: 1 .  venlafaxine XR (EFFEXOR-XR) 75 MG 24 hr capsule, TAKE ONE CAPSULE BY MOUTH ONCE A DAY WITH BREAKFAST, Disp: 90 capsule, Rfl: 1  EXAM:  VITALS per patient if applicable:BP (!) 143/90   Pulse 84   Resp 12   GENERAL: alert, oriented, appears well and in no acute distress  HEENT: atraumatic, conjunttiva clear, no obvious abnormalities on inspection of external nose and ears  NECK: normal movements of the head and neck  LUNGS: on inspection no signs of respiratory distress, breathing rate appears  normal, no obvious gross SOB, gasping or wheezing  CV: no obvious cyanosis  MS: moves all visible extremities without noticeable abnormality  PSYCH/NEURO: pleasant and cooperative, no obvious depression or anxiety, speech and thought processing grossly intact  ASSESSMENT AND PLAN:  Discussed the following assessment and plan:  Orders Placed This Encounter  Procedures  . Basic metabolic panel    Hypertension, essential, benign Not well controlled. Possible complications of elevated BP discussed. He would like to take lisinopril again, we discussed some side effects.  Given the fact his renal function is back to normal, we can try lisinopril again.  He has lisinopril-HCTZ 10-12.5 mg at home, so he will start taking it. Stop HCTZ 25 mg. No changes in propranolol 20 mg twice daily or amlodipine 10 mg daily. BMP check in 2-3 weeks and f/u in 3 months.   Anxiety disorder Problem is well controlled. Not sure if Propranolol is also helping. No changes on Effexor or clonazepam.   Depression, major, recurrent, moderate (HCC) Problem is well controlled. No changes in current management. Instructed about warning signs.  Class 2 obesity with body mass index (BMI) of 35.0 to 35.9 in adult We discussed benefits of wt loss as well as adverse effects of obesity. Consistency with healthy diet and physical activity recommended. Caution with following Keto diet,recommend not to do so for more than 3 months.   Insomnia disorder Well controlled. No changes in current management. Good sleep hygiene also recommended.  Hypokalemia Most likely related to HCTZ. K+ rich diet to continue for now. Lisinopril added today. BMP in 2-3 weeks.    I discussed the assessment and treatment plan with the patient. He was provided an opportunity to ask questions and all were answered. The patient agreed with the plan and demonstrated an understanding of the instructions.   The patient was advised to  call back or seek an in-person evaluation if the symptoms worsen or if the condition fails to improve as anticipated.  No follow-ups on file.    Betty [place], MD

## 2019-03-13 NOTE — Assessment & Plan Note (Signed)
Problem is well controlled. No changes in current management. Instructed about warning signs.

## 2019-03-13 NOTE — Assessment & Plan Note (Addendum)
Well controlled. No changes in current management. Good sleep hygiene also recommended.

## 2019-03-13 NOTE — Assessment & Plan Note (Addendum)
Problem is well controlled. Not sure if Propranolol is also helping. No changes on Effexor or clonazepam.

## 2019-03-13 NOTE — Assessment & Plan Note (Signed)
We discussed benefits of wt loss as well as adverse effects of obesity. Consistency with healthy diet and physical activity recommended. Caution with following Keto diet,recommend not to do so for more than 3 months.

## 2019-04-01 ENCOUNTER — Other Ambulatory Visit: Payer: Self-pay | Admitting: Family Medicine

## 2019-04-01 DIAGNOSIS — F419 Anxiety disorder, unspecified: Secondary | ICD-10-CM

## 2019-04-18 ENCOUNTER — Other Ambulatory Visit: Payer: Self-pay | Admitting: Family Medicine

## 2019-04-22 ENCOUNTER — Other Ambulatory Visit: Payer: Self-pay | Admitting: Family Medicine

## 2019-05-01 ENCOUNTER — Other Ambulatory Visit: Payer: Self-pay | Admitting: Family Medicine

## 2019-05-01 DIAGNOSIS — F331 Major depressive disorder, recurrent, moderate: Secondary | ICD-10-CM

## 2019-05-01 DIAGNOSIS — G4700 Insomnia, unspecified: Secondary | ICD-10-CM

## 2019-05-06 ENCOUNTER — Other Ambulatory Visit: Payer: Self-pay | Admitting: Family Medicine

## 2019-05-06 DIAGNOSIS — F339 Major depressive disorder, recurrent, unspecified: Secondary | ICD-10-CM

## 2019-06-10 ENCOUNTER — Other Ambulatory Visit: Payer: Self-pay | Admitting: Family Medicine

## 2019-06-10 DIAGNOSIS — I10 Essential (primary) hypertension: Secondary | ICD-10-CM

## 2019-06-28 ENCOUNTER — Encounter: Payer: Self-pay | Admitting: Family Medicine

## 2019-07-05 ENCOUNTER — Ambulatory Visit: Payer: Self-pay

## 2019-07-05 NOTE — Telephone Encounter (Signed)
Incoming message from Patient inquiring if patient may take Camie Patience with the lipitor  that he is prescribed.

## 2019-07-09 ENCOUNTER — Ambulatory Visit: Payer: Self-pay | Admitting: Family Medicine

## 2019-07-09 NOTE — Telephone Encounter (Signed)
Spoke to pt and have a spot held for a VV tomorrow.    Marc Sanders can you please schedule pt for VV at 4pm. I held the spot  Thank you in advance

## 2019-07-09 NOTE — Telephone Encounter (Signed)
Pt reports BP this am 90/64. States last 2 weeks has been 100/70. States recent weight loss of 30 lbs. "I think I need my BP meds adjusted with the weight loss." Reports mild dizziness this am, "With standing, not bad, nothing major." States is staying hydrated. On Norvasc 10mg  and lisinopril HCTZ 10-12.5mg  daily. Took both this am.   Pt requests response in MyChart regarding adjusting BP meds.  Care advise given,hydrate, slow positional changes, go to UC/ED if dizziness worsens.  Please advise:  CB# [phone]  Reason for Disposition . Brief (now gone) dizziness or lightheadedness after standing up or eating  Answer Assessment - Initial Assessment Questions 1. BLOOD PRESSURE: "What is the blood pressure?" "Did you take at least two measurements 5 minutes apart?"     90/64 2. ONSET: "When did you take your blood pressure?"     This am 3. HOW: "How did you obtain the blood pressure?" (e.g., visiting nurse, automatic home BP monitor)    Home monitor 4. HISTORY: "Do you have a history of low blood pressure?" "What is your blood pressure normally?"    Loss of 30lbs recently 5. MEDICATIONS: "Are you taking any medications for blood pressure?" If yes: "Have they been changed recently?"    Yes, no changes recently 6. PULSE RATE: "Do you know what your pulse rate is?"       7. OTHER SYMPTOMS: "Have you been sick recently?" "Have you had a recent injury?"     Mild lightheadedness "not bad, with standing, goes away."  Protocols used: LOW BLOOD PRESSURE-A-AH

## 2019-07-10 ENCOUNTER — Ambulatory Visit: Payer: 59 | Admitting: Family Medicine

## 2019-07-10 DIAGNOSIS — Z0289 Encounter for other administrative examinations: Secondary | ICD-10-CM

## 2019-09-09 ENCOUNTER — Other Ambulatory Visit: Payer: Self-pay | Admitting: Family Medicine

## 2019-09-09 DIAGNOSIS — I10 Essential (primary) hypertension: Secondary | ICD-10-CM

## 2019-09-18 ENCOUNTER — Other Ambulatory Visit: Payer: Self-pay | Admitting: Family Medicine

## 2019-09-18 DIAGNOSIS — I10 Essential (primary) hypertension: Secondary | ICD-10-CM

## 2019-09-25 ENCOUNTER — Other Ambulatory Visit: Payer: Self-pay | Admitting: Family Medicine

## 2019-09-25 DIAGNOSIS — E039 Hypothyroidism, unspecified: Secondary | ICD-10-CM

## 2019-09-27 NOTE — Telephone Encounter (Signed)
Patient need to schedule an ov and labs for more refills. Last TSh abnormal

## 2019-09-30 ENCOUNTER — Other Ambulatory Visit: Payer: Self-pay | Admitting: Family Medicine

## 2019-09-30 DIAGNOSIS — E039 Hypothyroidism, unspecified: Secondary | ICD-10-CM

## 2019-09-30 NOTE — Telephone Encounter (Signed)
Pt needs an Ov for thyroid f/u. Pt notified of denial and advised to schedule ov. Pt last

## 2019-10-02 NOTE — Telephone Encounter (Signed)
Pt called in and would let

## 2019-10-08 ENCOUNTER — Other Ambulatory Visit: Payer: Self-pay

## 2019-10-08 ENCOUNTER — Ambulatory Visit (INDEPENDENT_AMBULATORY_CARE_PROVIDER_SITE_OTHER): Payer: 59 | Admitting: Family Medicine

## 2019-10-08 VITALS — BP 140/100 | Ht 74.0 in

## 2019-10-08 DIAGNOSIS — E785 Hyperlipidemia, unspecified: Secondary | ICD-10-CM

## 2019-10-08 DIAGNOSIS — Z6835 Body mass index (BMI) 35.0-35.9, adult: Secondary | ICD-10-CM

## 2019-10-08 DIAGNOSIS — G4700 Insomnia, unspecified: Secondary | ICD-10-CM | POA: Diagnosis not present

## 2019-10-08 DIAGNOSIS — E039 Hypothyroidism, unspecified: Secondary | ICD-10-CM

## 2019-10-08 DIAGNOSIS — F419 Anxiety disorder, unspecified: Secondary | ICD-10-CM

## 2019-10-08 DIAGNOSIS — I10 Essential (primary) hypertension: Secondary | ICD-10-CM | POA: Diagnosis not present

## 2019-10-08 DIAGNOSIS — F331 Major depressive disorder, recurrent, moderate: Secondary | ICD-10-CM

## 2019-10-08 MED ORDER — LISINOPRIL 10 MG PO TABS: 10.0000 mg | ORAL_TABLET | Freq: Every day | ORAL | 1 refills | Status: DC

## 2019-10-08 MED ORDER — LEVOTHYROXINE SODIUM 75 MCG PO TABS: ORAL_TABLET | ORAL | 1 refills | Status: DC

## 2019-10-08 NOTE — Progress Notes (Signed)
Virtual Visit via Video Note   I connected with Marc Sanders on 10/08/19 by a video enabled telemedicine application and verified that I am speaking with the correct person using two identifiers.  Location patient: home Location provider:work office Persons participating in the virtual visit: patient, provider  I discussed the limitations of evaluation and management by telemedicine and the availability of in person appointments. The patient expressed understanding and agreed to proceed.   HPI: Marc Sanders is a 58 yo being seen today for chronic disease management. Last follow up on 03/13/19.  HTN: He discontinue Lisinopril-HCTZ 10-12.5 mg because BP was "dropping so low"  140's/90-100. He is on Amlodipine 10 mg daily and Propranolol 20 mg bid. Denies severe/frequent headache, visual changes, chest pain, dyspnea, palpitation, claudication, focal weakness, or edema.  Lab Results  Component Value Date   CREATININE 1.27 12/12/2018   BUN 17 12/12/2018   NA 138 12/12/2018   K 3.3 (L) 12/12/2018   CL 99 12/12/2018   CO2 30 12/12/2018    He lost wt, 30 Lb. He was walking more,for the past few weeks knee started hurting again. He started Fibro [employer] and it has helped with knee pain.  Drinking alcohol, about 1-2 shot of vodka.  Anxiety and depression: He is not longer taking Clonazepam.  His 57 yo committed suicide a few months ago. In general he feels like he is dealing well with his loss.  He is sleeping "good."  He is no longer taking melatonin. Currently he is on Effexor XR 75 mcg daily and Seroquel 100 mg daily.  Sleeping about 10 hours. He denies suicidal thoughts. Occasionally depressed mood when he remembers his son.   Hypothyroidism: Treatment was adjusted after last TSH. Currently he is on levothyroxine 75 mcg 1-1/2 tablet Mondays, and rest of the week 1 tablet. He did not follow as recommended. Negative for cold/heat intolerance, unusual fatigue, palpitations, changes  in bowel habits, or tremor.  Lab Results  Component Value Date   TSH 6.65 (H) 10/17/2018   HLD: He is on Atorvastatin 40 mg daily. Tolerating medication well.  Lab Results  Component Value Date   CHOL 166 03/21/2018   HDL 53.30 03/21/2018   LDLCALC 95 03/21/2018   TRIG 87.0 03/21/2018   CHOLHDL 3 03/21/2018   ROS: See pertinent positives and negatives per HPI.  Past Medical History:  Diagnosis Date  . Anxiety   . Carpal tunnel syndrome of right wrist   . Depression   . Hypothyroidism     Past Surgical History:  Procedure Laterality Date  . CARPAL TUNNEL RELEASE Right 03/01/2013   Procedure: CARPAL TUNNEL RELEASE;  Surgeon: Magnus Sinning, MD;  Location: [place];  Service: Orthopedics;  Laterality: Right;  . SHOULDER ARTHROSCOPY WITH OPEN ROTATOR CUFF REPAIR AND DISTAL CLAVICLE ACROMINECTOMY  12/19/2012   Procedure: SHOULDER ARTHROSCOPY WITH OPEN ROTATOR CUFF REPAIR AND DISTAL CLAVICLE ACROMINECTOMY;  Surgeon: Magnus Sinning, MD;  Location: [facility];  Service: Orthopedics;  Laterality: Left;  LEFT SHOULDER ARTHROSCOPY WITH LABRAL DEBRIDEMENT AND SUBACROMIAL DECOMPRESSION DISTAL CLAVICLE RESECTION AND MINI OPEN ROTATOR CUFF REPAIR WITH ANCHOR    Family History  Problem Relation Age of Onset  . Diabetes Mother   . Mental illness Mother   . Hypertension Mother   . Mental illness Father        Alzheimer's  . Hypertension Father   . AAA (abdominal aortic aneurysm) Father   . Mental illness Sister   . AAA (abdominal  aortic aneurysm) Paternal Uncle     Social History   Socioeconomic History  . Marital status: Single    Spouse name: Not on file  . Number of children: Not on file  . Years of education: Not on file  . Highest education level: Not on file  Occupational History  . Not on file  Social Needs  . Financial resource strain: Not on file  . Food insecurity    Worry: Not on file    Inability: Not on file  . Transportation needs     Medical: Not on file    Non-medical: Not on file  Tobacco Use  . Smoking status: Former Smoker    Packs/day: 1.00    Years: 8.00    Pack years: 8.00    Types: Cigarettes    Quit date: 12/05/1986    Years since quitting: 32.8  . Smokeless tobacco: Never Used  Substance and Sexual Activity  . Alcohol use: Yes    Alcohol/week: 7.0 standard drinks    Types: 7 Glasses of wine per week  . Drug use: No  . Sexual activity: Yes  Lifestyle  . Physical activity    Days per week: Not on file    Minutes per session: Not on file  . Stress: Not on file  Relationships  . Social Musicianconnections    Talks on phone: Not on file    Gets together: Not on file    Attends religious service: Not on file    Active member of club or organization: Not on file    Attends meetings of clubs or organizations: Not on file    Relationship status: Not on file  . Intimate partner violence    Fear of current or ex partner: Not on file    Emotionally abused: Not on file    Physically abused: Not on file    Forced sexual activity: Not on file  Other Topics Concern  . Not on file  Social History Narrative  . Not on file    Current Outpatient Medications:  .  amLODipine (NORVASC) 10 MG tablet, Take 1 tablet (10 mg total) by mouth daily., Disp: 90 tablet, Rfl: 1 .  atorvastatin (LIPITOR) 40 MG tablet, Take 1 tablet (40 mg total) by mouth daily., Disp: 90 tablet, Rfl: 2 .  levothyroxine (SYNTHROID) 75 MCG tablet, TAKE 1 1/2 TABLETS ON MONDAY IN THE MORNING AND 1 TABLET THE REST OF THE DAYS, Disp: 96 tablet, Rfl: 1 .  lisinopril (ZESTRIL) 10 MG tablet, Take 1 tablet (10 mg total) by mouth daily., Disp: 90 tablet, Rfl: 1 .  Melatonin ER 5 MG TBCR, Take 5 mg by mouth at bedtime., Disp: 90 tablet, Rfl: 1 .  propranolol (INDERAL) 20 MG tablet, Take 1 tablet (20 mg total) by mouth 2 (two) times daily., Disp: 180 tablet, Rfl: 1 .  QUEtiapine (SEROQUEL) 100 MG tablet, TAKE 1 TABLET (100 MG TOTAL) BY MOUTH AT BEDTIME., Disp:  90 tablet, Rfl: 2 .  venlafaxine XR (EFFEXOR-XR) 75 MG 24 hr capsule, TAKE ONE CAPSULE BY MOUTH ONCE A DAY WITH BREAKFAST, Disp: 90 capsule, Rfl: 1  EXAM:  VITALS per patient if applicable:BP (!) 140/100   Ht 6\' 2"  (1.88 m)   BMI 34.92 kg/m   GENERAL: alert, oriented, appears well and in no acute distress  HEENT: atraumatic, conjunctiva clear, no obvious abnormalities on inspection.  NECK: normal movements of the head and neck  LUNGS: on inspection no signs of respiratory distress, breathing  rate appears normal, no obvious gross SOB, gasping or wheezing  CV: no obvious cyanosis  MS: moves all visible extremities without noticeable abnormality  PSYCH/NEURO: pleasant and cooperative, no obvious depression or anxiety, speech and thought processing grossly intact  ASSESSMENT AND PLAN:  Discussed the following assessment and plan:  Orders Placed This Encounter  Procedures  . CMP  . TSH  . Lipid panel   Insomnia, unspecified type Reporting improvement. Good sleep hygiene. Continue Seroquel 100 mg at bedtime.  Hypertension, essential, benign - Plan: lisinopril (ZESTRIL) 10 MG tablet, amLODipine (NORVASC) 10 MG tablet, propranolol (INDERAL) 20 MG tablet Reported BP's still elevated. Recommend resuming Lisinopril 10 mg daily. No changes in Amlodipine or Propranolol.  Depression, major, recurrent, moderate (HCC) Stable. Continue Effexor XR and Seroquel. Decreasing alcohol intake recommended. Instructed about warning signs.  Anxiety disorder, unspecified type Stable. Not longer on Clonazepam. Continue Effexor XR 75 mg daily,Propranolol 20 mg bid. Instructed about warning signs.  Hypothyroidism, unspecified type - Plan: levothyroxine (SYNTHROID) 75 MCG tablet No changes in current management. Further recommendations will be given according to TSH, lab appt will be arranged.  Hyperlipidemia, unspecified hyperlipidemia type Continue Atorvastatin 40 mg daily and low fat  diet.   Class 2 severe obesity due to excess calories with serious comorbidity and body mass index (BMI) of 35.0 to 35.9 in adult [facility]) We discussed benefits of wt loss as well as adverse effects of obesity. Consistency with healthy diet and physical activity recommended.   I discussed the assessment and treatment plan with the patient. He was provided an opportunity to ask questions and all were answered. He agreed with the plan and demonstrated an understanding of the instructions.    Return in about 6 months (around 04/06/2020) for CPE. Lab appt next week (fasting labs).    Betty Swazil[place], MD

## 2019-10-12 ENCOUNTER — Encounter: Payer: Self-pay | Admitting: Family Medicine

## 2019-10-12 MED ORDER — AMLODIPINE BESYLATE 10 MG PO TABS: 10.0000 mg | ORAL_TABLET | Freq: Every day | ORAL | 1 refills | Status: DC

## 2019-10-12 MED ORDER — ATORVASTATIN CALCIUM 40 MG PO TABS: 40.0000 mg | ORAL_TABLET | Freq: Every day | ORAL | 2 refills | Status: DC

## 2019-10-12 MED ORDER — VENLAFAXINE HCL ER 75 MG PO CP24: ORAL_CAPSULE | ORAL | 1 refills | Status: DC

## 2019-10-12 MED ORDER — PROPRANOLOL HCL 20 MG PO TABS: 20.0000 mg | ORAL_TABLET | Freq: Two times a day (BID) | ORAL | 1 refills | Status: DC

## 2019-10-23 ENCOUNTER — Other Ambulatory Visit: Payer: Self-pay

## 2019-10-23 ENCOUNTER — Other Ambulatory Visit (INDEPENDENT_AMBULATORY_CARE_PROVIDER_SITE_OTHER): Payer: 59

## 2019-10-23 DIAGNOSIS — E039 Hypothyroidism, unspecified: Secondary | ICD-10-CM

## 2019-10-23 DIAGNOSIS — I10 Essential (primary) hypertension: Secondary | ICD-10-CM | POA: Diagnosis not present

## 2019-10-23 DIAGNOSIS — E785 Hyperlipidemia, unspecified: Secondary | ICD-10-CM | POA: Diagnosis not present

## 2019-10-23 LAB — COMPREHENSIVE METABOLIC PANEL
ALT: 45 U/L (ref 0–53)
AST: 37 U/L (ref 0–37)
Albumin: 4.7 g/dL (ref 3.5–5.2)
Alkaline Phosphatase: 59 U/L (ref 39–117)
BUN: 14 mg/dL (ref 6–23)
CO2: 26 mEq/L (ref 19–32)
Calcium: 9.3 mg/dL (ref 8.4–10.5)
Chloride: 101 mEq/L (ref 96–112)
Creatinine, Ser: 1.15 mg/dL (ref 0.40–1.50)
GFR: 65.52 mL/min (ref >60.00)
Glucose, Bld: 113 mg/dL — ABNORMAL HIGH (ref 70–99)
Potassium: 4.2 mEq/L (ref 3.5–5.1)
Sodium: 137 mEq/L (ref 135–145)
Total Bilirubin: 0.5 mg/dL (ref 0.2–1.2)
Total Protein: 7.5 g/dL (ref 6.0–8.3)

## 2019-10-23 LAB — LIPID PANEL
Cholesterol: 172 mg/dL (ref 0–200)
HDL: 69.2 mg/dL (ref >39.00)
LDL Cholesterol: 78 mg/dL (ref 0–99)
NonHDL: 102.84
Total CHOL/HDL Ratio: 2
Triglycerides: 122 mg/dL (ref 0.0–149.0)
VLDL: 24.4 mg/dL (ref 0.0–40.0)

## 2019-10-23 LAB — TSH: TSH: 8.04 u[IU]/mL — ABNORMAL HIGH (ref 0.35–4.50)

## 2019-10-30 ENCOUNTER — Encounter: Payer: Self-pay | Admitting: Family Medicine

## 2019-10-30 ENCOUNTER — Other Ambulatory Visit: Payer: Self-pay | Admitting: Family Medicine

## 2019-10-30 DIAGNOSIS — E039 Hypothyroidism, unspecified: Secondary | ICD-10-CM

## 2019-10-30 MED ORDER — LEVOTHYROXINE SODIUM 88 MCG PO TABS: 88.0000 ug | ORAL_TABLET | Freq: Every day | ORAL | 0 refills | Status: DC

## 2019-11-15 NOTE — Progress Notes (Signed)
Patient is scheduled for a physical on 03/24/2019 at 8 AM

## 2019-11-26 ENCOUNTER — Encounter: Payer: Self-pay | Admitting: Family Medicine

## 2019-11-27 ENCOUNTER — Other Ambulatory Visit: Payer: Self-pay | Admitting: Family Medicine

## 2019-11-27 DIAGNOSIS — F419 Anxiety disorder, unspecified: Secondary | ICD-10-CM

## 2019-11-27 MED ORDER — HYDROXYZINE HCL 25 MG PO TABS: 25.0000 mg | ORAL_TABLET | Freq: Two times a day (BID) | ORAL | 1 refills | Status: DC | PRN

## 2020-01-22 ENCOUNTER — Other Ambulatory Visit: Payer: Self-pay | Admitting: Family Medicine

## 2020-01-22 DIAGNOSIS — G4700 Insomnia, unspecified: Secondary | ICD-10-CM

## 2020-01-22 DIAGNOSIS — F331 Major depressive disorder, recurrent, moderate: Secondary | ICD-10-CM

## 2020-01-30 ENCOUNTER — Other Ambulatory Visit: Payer: Self-pay | Admitting: Family Medicine

## 2020-03-23 ENCOUNTER — Encounter: Payer: 59 | Admitting: Family Medicine

## 2020-03-24 ENCOUNTER — Other Ambulatory Visit: Payer: Self-pay | Admitting: Family Medicine

## 2020-03-24 DIAGNOSIS — I10 Essential (primary) hypertension: Secondary | ICD-10-CM

## 2020-04-01 ENCOUNTER — Encounter: Payer: 59 | Admitting: Family Medicine

## 2020-04-14 ENCOUNTER — Other Ambulatory Visit: Payer: Self-pay

## 2020-04-15 ENCOUNTER — Encounter: Payer: Self-pay | Admitting: Family Medicine

## 2020-04-15 ENCOUNTER — Ambulatory Visit (INDEPENDENT_AMBULATORY_CARE_PROVIDER_SITE_OTHER): Payer: 59 | Admitting: Family Medicine

## 2020-04-15 VITALS — BP 110/70 | HR 66 | Temp 97.4°F | Resp 16 | Ht 74.0 in | Wt 264.0 lb

## 2020-04-15 DIAGNOSIS — Z125 Encounter for screening for malignant neoplasm of prostate: Secondary | ICD-10-CM | POA: Diagnosis not present

## 2020-04-15 DIAGNOSIS — Z1329 Encounter for screening for other suspected endocrine disorder: Secondary | ICD-10-CM | POA: Diagnosis not present

## 2020-04-15 DIAGNOSIS — E785 Hyperlipidemia, unspecified: Secondary | ICD-10-CM

## 2020-04-15 DIAGNOSIS — I10 Essential (primary) hypertension: Secondary | ICD-10-CM

## 2020-04-15 DIAGNOSIS — Z0000 Encounter for general adult medical examination without abnormal findings: Secondary | ICD-10-CM

## 2020-04-15 DIAGNOSIS — E6609 Other obesity due to excess calories: Secondary | ICD-10-CM

## 2020-04-15 DIAGNOSIS — Z13228 Encounter for screening for other metabolic disorders: Secondary | ICD-10-CM

## 2020-04-15 DIAGNOSIS — Z6833 Body mass index (BMI) 33.0-33.9, adult: Secondary | ICD-10-CM

## 2020-04-15 DIAGNOSIS — Z130 Encounter for screening for diseases of the blood and blood-forming organs and certain disorders involving the immune mechanism: Secondary | ICD-10-CM

## 2020-04-15 DIAGNOSIS — E039 Hypothyroidism, unspecified: Secondary | ICD-10-CM | POA: Diagnosis not present

## 2020-04-15 DIAGNOSIS — F331 Major depressive disorder, recurrent, moderate: Secondary | ICD-10-CM

## 2020-04-15 LAB — COMPREHENSIVE METABOLIC PANEL
ALT: 67 U/L — ABNORMAL HIGH (ref 0–53)
AST: 46 U/L — ABNORMAL HIGH (ref 0–37)
Albumin: 4.6 g/dL (ref 3.5–5.2)
Alkaline Phosphatase: 55 U/L (ref 39–117)
BUN: 17 mg/dL (ref 6–23)
CO2: 27 mEq/L (ref 19–32)
Calcium: 9.4 mg/dL (ref 8.4–10.5)
Chloride: 98 mEq/L (ref 96–112)
Creatinine, Ser: 1.13 mg/dL (ref 0.40–1.50)
GFR: 66.74 mL/min (ref >60.00)
Glucose, Bld: 91 mg/dL (ref 70–99)
Potassium: 4.1 mEq/L (ref 3.5–5.1)
Sodium: 136 mEq/L (ref 135–145)
Total Bilirubin: 0.5 mg/dL (ref 0.2–1.2)
Total Protein: 7.4 g/dL (ref 6.0–8.3)

## 2020-04-15 LAB — PSA: PSA: 0.65 ng/mL (ref 0.10–4.00)

## 2020-04-15 LAB — HEMOGLOBIN A1C: Hgb A1c MFr Bld: 5.9 % (ref 4.6–6.5)

## 2020-04-15 LAB — TSH: TSH: 9.93 u[IU]/mL — ABNORMAL HIGH (ref 0.35–4.50)

## 2020-04-15 MED ORDER — ATORVASTATIN CALCIUM 40 MG PO TABS: 40.0000 mg | ORAL_TABLET | Freq: Every day | ORAL | 2 refills | Status: DC

## 2020-04-15 MED ORDER — VENLAFAXINE HCL ER 75 MG PO CP24: ORAL_CAPSULE | ORAL | 1 refills | Status: DC

## 2020-04-15 MED ORDER — LEVOTHYROXINE SODIUM 100 MCG PO TABS: 100.0000 ug | ORAL_TABLET | Freq: Every day | ORAL | 0 refills | Status: DC

## 2020-04-15 MED ORDER — LISINOPRIL 10 MG PO TABS: 10.0000 mg | ORAL_TABLET | Freq: Every day | ORAL | 2 refills | Status: DC

## 2020-04-15 NOTE — Progress Notes (Signed)
HPI:  [name] is a 59 y.o.male here today for his routine physical examination.  Last CPE: 03/2018. He lives alone. Family lives close by.  Regular exercise 3 or more times per week: No, knee pain is getting worse. Following a healthy diet: He decreased bread intake about 80%,bake chicken,vegetables. Decreased starches serving portions. Sleeping about 8 to 9 hours. He drinks vodka, 4 to 6 ounces daily at night.  Chronic medical problems: Depression, anxiety, hypertension, CKD 3, hyperlipidemia, and OA among some.  Hx of STD's: Negative. He is not sexually active.  Immunization History  Administered Date(s) Administered  . Influenza,inj,Quad PF,6+ Mos 11/17/2016, 10/03/2017, 08/31/2018  . Influenza-Unspecified 08/29/2018  . Moderna SARS-COVID-2 Vaccination 02/07/2020, 03/06/2020  . Tdap 03/15/2017   -Hep C screening: 03/2017 NR  Last colon cancer screening: I have not received copy of colonoscopy done at 59 yo. He reports that it was normal,no polyps. 10 years follow up was recommended. Last prostate ca screening:  Nocturia x 1, stable for years.  Lab Results  Component Value Date   PSA 0.46 03/21/2018   PSA 0.36 07/19/2017   No Hx of illicit drug use. Former smoker.  Depression, anxiety, and insomnia: He has not taking clonazepam in a few months now, he would like to keep it on his medication list in case he needs it in the future. He is on Seroquel 100 mg at bedtime and Effexor XR 75 mg daily. He also has hydroxyzine 25 mg to take for anxiety if needed, he has not taken it in a while.  Hypothyroidism: Currently he is on levothyroxine 88 mcg daily.  Lab Results  Component Value Date   TSH 8.04 (H) 10/23/2019   Review of Systems  Constitutional: Positive for fatigue. Negative for activity change, appetite change and fever.  HENT: Negative for dental problem, mouth sores, nosebleeds and sore throat.   Eyes: Negative for redness and visual  disturbance.  Respiratory: Negative for cough, shortness of breath and wheezing.   Cardiovascular: Negative for chest pain, palpitations and leg swelling.  Gastrointestinal: Negative for abdominal pain, blood in stool, nausea and vomiting.  Endocrine: Negative for cold intolerance, heat intolerance, polydipsia, polyphagia and polyuria.  Genitourinary: Negative for decreased urine volume, dysuria, genital sores, hematuria and testicular pain.  Musculoskeletal: Positive for arthralgias and gait problem. Negative for myalgias.  Skin: Negative for color change and rash.  Allergic/Immunologic: Positive for environmental allergies.  Neurological: Negative for syncope, weakness and headaches.  Hematological: Negative for adenopathy. Does not bruise/bleed easily.  Psychiatric/Behavioral: Positive for sleep disturbance. Negative for confusion. The patient is nervous/anxious.   All other systems reviewed and are negative.  Current Outpatient Medications on File Prior to Visit  Medication Sig Dispense Refill  . amLODipine (NORVASC) 10 MG tablet Take 1 tablet (10 mg total) by mouth daily. 90 tablet 1  . clonazePAM (KLONOPIN) 0.5 MG tablet Take by mouth.    . hydrOXYzine (ATARAX/VISTARIL) 25 MG tablet Take 1-2 tablets (25-50 mg total) by mouth 2 (two) times daily as needed for itching. 30 tablet 1  . levothyroxine (SYNTHROID) 88 MCG tablet Take 1 tablet (88 mcg total) by mouth daily before breakfast. 30 tablet 3  . propranolol (INDERAL) 20 MG tablet Take 1 tablet (20 mg total) by mouth 2 (two) times daily. 180 tablet 1  . QUEtiapine (SEROQUEL) 100 MG tablet TAKE 1 TABLET (100 MG TOTAL) BY MOUTH AT BEDTIME. 90 tablet 2   No current facility-administered medications on file  prior to visit.   Past Medical History:  Diagnosis Date  . Anxiety   . Carpal tunnel syndrome of right wrist   . Depression   . Hypothyroidism    Past Surgical History:  Procedure Laterality Date  . CARPAL TUNNEL RELEASE Right  03/01/2013   Procedure: CARPAL TUNNEL RELEASE;  Surgeon: Magnus Sinning, MD;  Location: [facility];  Service: Orthopedics;  Laterality: Right;  . SHOULDER ARTHROSCOPY WITH OPEN ROTATOR CUFF REPAIR AND DISTAL CLAVICLE ACROMINECTOMY  12/19/2012   Procedure: SHOULDER ARTHROSCOPY WITH OPEN ROTATOR CUFF REPAIR AND DISTAL CLAVICLE ACROMINECTOMY;  Surgeon: Magnus Sinning, MD;  Location: [facility];  Service: Orthopedics;  Laterality: Left;  LEFT SHOULDER ARTHROSCOPY WITH LABRAL DEBRIDEMENT AND SUBACROMIAL DECOMPRESSION DISTAL CLAVICLE RESECTION AND MINI OPEN ROTATOR CUFF REPAIR WITH ANCHOR    No Known Allergies  Family History  Problem Relation Age of Onset  . Diabetes Mother   . Mental illness Mother   . Hypertension Mother   . Mental illness Father        Alzheimer's  . Hypertension Father   . AAA (abdominal aortic aneurysm) Father   . Mental illness Sister   . AAA (abdominal aortic aneurysm) Paternal Uncle     Social History   Socioeconomic History  . Marital status: Single    Spouse name: Not on file  . Number of children: Not on file  . Years of education: Not on file  . Highest education level: Not on file  Occupational History  . Not on file  Tobacco Use  . Smoking status: Former Smoker    Packs/day: 1.00    Years: 8.00    Pack years: 8.00    Types: Cigarettes    Quit date: 12/05/1986    Years since quitting: 33.3  . Smokeless tobacco: Never Used  Substance and Sexual Activity  . Alcohol use: Yes    Alcohol/week: 7.0 standard drinks    Types: 7 Glasses of wine per week  . Drug use: No  . Sexual activity: Yes  Other Topics Concern  . Not on file  Social History Narrative  . Not on file   Social Determinants of Health   Financial Resource Strain:   . Difficulty of Paying Living Expenses:   Food Insecurity:   . Worried About Charity fundraiser in the Last Year:   . Arboriculturist in the Last Year:   Transportation Needs:   . Lexicographer (Medical):   Marland Kitchen Lack of Transportation (Non-Medical):   Physical Activity:   . Days of Exercise per Week:   . Minutes of Exercise per Session:   Stress:   . Feeling of Stress :   Social Connections:   . Frequency of Communication with Friends and Family:   . Frequency of Social Gatherings with Friends and Family:   . Attends Religious Services:   . Active Member of Clubs or Organizations:   . Attends Archivist Meetings:   Marland Kitchen Marital Status:    Vitals:   04/15/20 1050  BP: 110/70  Pulse: 66  Resp: 16  Temp: (!) 97.4 F (36.3 C)  SpO2: 97%   Body mass index is 33.9 kg/m.  Wt Readings from Last 3 Encounters:  04/15/20 264 lb (119.7 kg)  12/12/18 272 lb (123.4 kg)  10/03/18 263 lb 12.8 oz (119.7 kg)   Physical Exam  Nursing note and vitals reviewed. Constitutional: He is oriented to person, place, and time. He  appears well-developed. No distress.  HENT:  Head: Normocephalic and atraumatic.  Right Ear: Tympanic membrane, external ear and ear canal normal.  Left Ear: Tympanic membrane, external ear and ear canal normal.  Mouth/Throat: Oropharynx is clear and moist and mucous membranes are normal.  Eyes: Pupils are equal, round, and reactive to light. Conjunctivae and EOM are normal.  Neck: No tracheal deviation present. No thyromegaly present.  Cardiovascular: Normal rate and regular rhythm.  No murmur heard. Pulses:      Dorsalis pedis pulses are 2+ on the right side and 2+ on the left side.  Respiratory: Effort normal and breath sounds normal. No respiratory distress.  GI: Soft. He exhibits no mass. There is no hepatomegaly. There is no abdominal tenderness.  Genitourinary:    Genitourinary Comments: Refused,no concerns.   Musculoskeletal:        General: No tenderness or edema.     Cervical back: Normal range of motion.     Left knee: Decreased range of motion.     Comments: No signs of synovitis. Antalgic gait.  Lymphadenopathy:    He has  no cervical adenopathy.       Right: No supraclavicular adenopathy present.       Left: No supraclavicular adenopathy present.  Neurological: He is alert and oriented to person, place, and time. He has normal strength. No cranial nerve deficit or sensory deficit. Coordination and gait normal.  Reflex Scores:      Bicep reflexes are 2+ on the right side and 2+ on the left side.      Patellar reflexes are 2+ on the right side and 2+ on the left side. Skin: Skin is warm. No erythema.  Psychiatric: He has a normal mood and affect.  Well-groomed, good eye contact.    ASSESSMENT AND PLAN:  [name] was seen today for annual exam.  Diagnoses and all orders for this visit:  Orders Placed This Encounter  Procedures  . Comprehensive metabolic panel  . PSA(Must document that pt has been informed of limitations of PSA testing.)  . Hemoglobin A1c  . TSH   Lab Results  Component Value Date   HGBA1C 5.9 04/15/2020   Lab Results  Component Value Date   TSH 9.93 (H) 04/15/2020   Lab Results  Component Value Date   PSA 0.65 04/15/2020   Lab Results  Component Value Date   CREATININE 1.13 04/15/2020   BUN 17 04/15/2020   NA 136 04/15/2020   K 4.1 04/15/2020   CL 98 04/15/2020   CO2 27 04/15/2020   Lab Results  Component Value Date   ALT 67 (H) 04/15/2020   AST 46 (H) 04/15/2020   ALKPHOS 55 04/15/2020   BILITOT 0.5 04/15/2020     Routine general medical examination at a health care facility We discussed the importance of regular physical activity and healthy diet for prevention of chronic illness and/or complications. Preventive guidelines reviewed. Vaccination up-to-date. Advise caution with amount of alcohol intake.  Next CPE in a year.  Prostate cancer screening -     PSA(Must document that pt has been informed of limitations of PSA testing.)  Screening for endocrine, metabolic and immunity disorder -     Comprehensive metabolic panel -     Hemoglobin  A1c  Hypertension, essential, benign BP adequately controlled. Continue low-salt diet. No changes in current management.  Depression, major, recurrent, moderate (HCC) PHQ score 1. Adequately controlled. No changes in current management.  Hypothyroidism Continue levothyroxine 88 mcg daily. Further  recommendation will be given according to TSH result.  Class 1 obesity with body mass index (BMI) of 33.0 to 33.9 in adult Encouraged to continue working on a healthful diet. Due to knee OA, it is difficult for him to engage in regular physical activity.  Recommend following with orthopedist, he may need TKR.  Return in 6 months (on 10/16/2020) for HTN,anxiety,depression,HLD.  Marc Thayer G. [place], MD  [facility]. [facility] office.  A few things to remember from today's visit:  At least 150 minutes of moderate exercise per week, daily brisk walking for 15-30 min is a good exercise option. Healthy diet low in saturated (animal) fats and sweets and consisting of fresh fruits and vegetables, lean meats such as fish and white chicken and whole grains.  - Vaccines:  Tdap vaccine every 10 years.  Shingles vaccine recommended at age 12, could be given after 58 years of age but not sure about insurance coverage.  Pneumonia vaccines:  Prevnar 13 at 65 and Pneumovax at 63.  -Screening recommendations for low/normal risk males:  Screening for diabetes at age 107 and every 3 years. Earlier screening if cardiovascular risk factors.   Lipid screening at 35 and every 3 years. Screening starts in younger males with cardiovascular risk factors. N/A  Colon cancer screening at age 41 and until age 6.  Prostate cancer screening: some controversy, starts usually at 50: Rectal exam and PSA.  Aortic Abdominal Aneurism once between 100 and 66 years old if ever smoker.  Also recommended:  1. Dental visit- Brush and floss your teeth twice daily; visit your dentist twice a year. 2. Eye  doctor- Get an eye exam at least every 2 years. 3. Helmet use- Always wear a helmet when riding a bicycle, motorcycle, rollerblading or skateboarding. 4. Safe sex- If you may be exposed to sexually transmitted infections, use a condom. 5. Seat belts- Seat belts can save your live; always wear one. 6. Smoke/Carbon Monoxide detectors- These detectors need to be installed on the appropriate level of your home. Replace batteries at least once a year. 7. Skin cancer- When out in the sun please cover up and use sunscreen 15 SPF or higher. 8. Violence- If anyone is threatening or hurting you, please tell your healthcare provider.  9. Drink alcohol in moderation- Limit alcohol intake to one drink or less per day. Never drink and drive.  If you need refills please call your pharmacy. Do not use My Chart to request refills or for acute issues that need immediate attention.    Please be sure medication list is accurate. If a new problem present, please set up appointment sooner than planned today.

## 2020-04-15 NOTE — Assessment & Plan Note (Signed)
PHQ score 1. Adequately controlled. No changes in current management.

## 2020-04-15 NOTE — Assessment & Plan Note (Signed)
Encouraged to continue working on a healthful diet. Due to knee OA, it is difficult for him to engage in regular physical activity.  Recommend following with orthopedist, he may need TKR.

## 2020-04-15 NOTE — Patient Instructions (Addendum)
A few things to remember from today's visit:   Routine general medical examination at a health care facility  Hypertension, essential, benign  Hypothyroidism, unspecified type - Plan: TSH  Prostate cancer screening - Plan: PSA(Must document that pt has been informed of limitations of PSA testing.)  Screening for endocrine, metabolic and immunity disorder - Plan: Comprehensive metabolic panel, Hemoglobin A1c   At least 150 minutes of moderate exercise per week, daily brisk walking for 15-30 min is a good exercise option. Healthy diet low in saturated (animal) fats and sweets and consisting of fresh fruits and vegetables, lean meats such as fish and white chicken and whole grains.  - Vaccines:  Tdap vaccine every 10 years.  Shingles vaccine recommended at age 27, could be given after 58 years of age but not sure about insurance coverage.  Pneumonia vaccines:  Prevnar 13 at 65 and Pneumovax at 70.   -Screening recommendations for low/normal risk males:  Screening for diabetes at age 93 and every 3 years. Earlier screening if cardiovascular risk factors.   Lipid screening at 35 and every 3 years. Screening starts in younger males with cardiovascular risk factors. N/A  Colon cancer screening at age 83 and until age 16.  Prostate cancer screening: some controversy, starts usually at 50: Rectal exam and PSA.  Aortic Abdominal Aneurism once between 62 and 49 years old if ever smoker.  Also recommended:  1. Dental visit- Brush and floss your teeth twice daily; visit your dentist twice a year. 2. Eye doctor- Get an eye exam at least every 2 years. 3. Helmet use- Always wear a helmet when riding a bicycle, motorcycle, rollerblading or skateboarding. 4. Safe sex- If you may be exposed to sexually transmitted infections, use a condom. 5. Seat belts- Seat belts can save your live; always wear one. 6. Smoke/Carbon Monoxide detectors- These detectors need to be installed on the  appropriate level of your home. Replace batteries at least once a year. 7. Skin cancer- When out in the sun please cover up and use sunscreen 15 SPF or higher. 8. Violence- If anyone is threatening or hurting you, please tell your healthcare provider.  9. Drink alcohol in moderation- Limit alcohol intake to one drink or less per day. Never drink and drive.  If you need refills please call your pharmacy. Do not use My Chart to request refills or for acute issues that need immediate attention.    Please be sure medication list is accurate. If a new problem present, please set up appointment sooner than planned today.

## 2020-04-15 NOTE — Assessment & Plan Note (Signed)
Continue levothyroxine 88 mcg daily. Further recommendation will be given according to TSH result.

## 2020-04-15 NOTE — Assessment & Plan Note (Signed)
BP adequately controlled. Continue low salt diet. No changes in current management.  

## 2020-04-21 ENCOUNTER — Other Ambulatory Visit: Payer: Self-pay | Admitting: Family Medicine

## 2020-04-21 DIAGNOSIS — I10 Essential (primary) hypertension: Secondary | ICD-10-CM

## 2020-05-05 ENCOUNTER — Other Ambulatory Visit: Payer: Self-pay | Admitting: Family Medicine

## 2020-05-05 DIAGNOSIS — I10 Essential (primary) hypertension: Secondary | ICD-10-CM

## 2020-05-13 ENCOUNTER — Other Ambulatory Visit: Payer: Self-pay | Admitting: Family Medicine

## 2020-05-14 ENCOUNTER — Encounter: Payer: Self-pay | Admitting: Family Medicine

## 2020-05-14 NOTE — Telephone Encounter (Signed)
Please advise 

## 2020-06-16 ENCOUNTER — Telehealth: Payer: Self-pay | Admitting: Family Medicine

## 2020-06-16 NOTE — Telephone Encounter (Signed)
Pt stated the last time he spoke to PCP she stated she was going to increase the dosage on Seroquel. Pt is needing a refill with the correct dosage.   [facility] - [place], [place] - [address] Phone:  [phone]  Fax:  [phone]

## 2020-06-16 NOTE — Telephone Encounter (Signed)
Pt sent in a mychart message back in June and you two discussed increasing the dosage on his Seroquel. Pt had physical in 04/2020.

## 2020-06-17 ENCOUNTER — Other Ambulatory Visit: Payer: Self-pay | Admitting: Family Medicine

## 2020-06-17 DIAGNOSIS — I10 Essential (primary) hypertension: Secondary | ICD-10-CM

## 2020-06-17 NOTE — Telephone Encounter (Signed)
Rx last filled 05/18/20. 

## 2020-06-17 NOTE — Telephone Encounter (Signed)
He has Seroquel 100 mg, he can start taking 1-1/2 tablet at bedtime and can increase it up to 200 mg (2 tabs). 3 to 4 weeks follow-up appointment. Thanks, BJ

## 2020-06-18 ENCOUNTER — Telehealth: Payer: Self-pay | Admitting: Family Medicine

## 2020-06-18 NOTE — Telephone Encounter (Signed)
Pt is calling in wanting to see if he can get some type of sleeping medication due to him not being able to sleep.  Pt is aware that he maybe ask to make an appointment for this Rx.

## 2020-06-19 MED ORDER — QUETIAPINE FUMARATE 200 MG PO TABS
200.0000 mg | ORAL_TABLET | Freq: Every day | ORAL | 1 refills | Status: DC
Start: 2020-06-19 — End: 2020-08-21

## 2020-06-19 NOTE — Telephone Encounter (Signed)
I spoke with patient. He is aware of information below & verbalized understanding. Rx sent in, pt aware Dr. Swaziland has to refill the Klonopin.

## 2020-06-19 NOTE — Telephone Encounter (Signed)
See other phone note

## 2020-06-19 NOTE — Addendum Note (Signed)
Addended by: Weyman Croon E on: 06/19/2020 12:03 PM   Modules accepted: Orders

## 2020-08-05 ENCOUNTER — Other Ambulatory Visit: Payer: Self-pay | Admitting: Cardiology

## 2020-08-05 ENCOUNTER — Telehealth: Payer: Self-pay | Admitting: Family Medicine

## 2020-08-05 DIAGNOSIS — I10 Essential (primary) hypertension: Secondary | ICD-10-CM

## 2020-08-05 MED ORDER — PROPRANOLOL HCL 20 MG PO TABS: 20.0000 mg | ORAL_TABLET | Freq: Two times a day (BID) | ORAL | 5 refills | Status: DC

## 2020-08-05 NOTE — Telephone Encounter (Signed)
Rx sent in as requested. 

## 2020-08-05 NOTE — Telephone Encounter (Signed)
propranolol (INDERAL) 20 MG tablet    [facility] - [place], [place] - [address] Phone:  [phone]  Fax:  [phone]

## 2020-08-06 ENCOUNTER — Encounter: Payer: Self-pay | Admitting: Family Medicine

## 2020-08-12 ENCOUNTER — Encounter: Payer: Self-pay | Admitting: Family Medicine

## 2020-08-12 ENCOUNTER — Telehealth (INDEPENDENT_AMBULATORY_CARE_PROVIDER_SITE_OTHER): Payer: 59 | Admitting: Family Medicine

## 2020-08-12 ENCOUNTER — Other Ambulatory Visit: Payer: Self-pay | Admitting: Family Medicine

## 2020-08-12 DIAGNOSIS — G4700 Insomnia, unspecified: Secondary | ICD-10-CM | POA: Diagnosis not present

## 2020-08-12 DIAGNOSIS — F419 Anxiety disorder, unspecified: Secondary | ICD-10-CM | POA: Diagnosis not present

## 2020-08-12 DIAGNOSIS — F331 Major depressive disorder, recurrent, moderate: Secondary | ICD-10-CM | POA: Diagnosis not present

## 2020-08-12 DIAGNOSIS — F1010 Alcohol abuse, uncomplicated: Secondary | ICD-10-CM | POA: Diagnosis not present

## 2020-08-12 DIAGNOSIS — I10 Essential (primary) hypertension: Secondary | ICD-10-CM

## 2020-08-12 DIAGNOSIS — F10182 Alcohol abuse with alcohol-induced sleep disorder: Secondary | ICD-10-CM | POA: Insufficient documentation

## 2020-08-12 MED ORDER — ESZOPICLONE 3 MG PO TABS: 3.0000 mg | ORAL_TABLET | Freq: Every day | ORAL | 0 refills | Status: DC

## 2020-08-12 NOTE — Progress Notes (Signed)
Virtual Visit via Telephone Note  I connected with Marc Sanders on 9/8/21at  5:00 PM EDT by telephone and verified that I am speaking with the correct person using two identifiers.   I discussed the limitations, risks, security and privacy concerns of performing an evaluation and management service by telephone and the availability of in person appointments. I also discussed with the patient that there may be a patient responsible charge related to this service. The patient expressed understanding and agreed to proceed.  Location patient: home Location provider: work office Participants present for the call: patient, provider Patient did not have a visit in the prior 7 days to address this/these issue(s).  History of Present Illness: Marc Sanders is a 59 yo male with hx of depression,anxiety,HTN, high alcohol intake,and HLD c/o worsening insomnia for the past week. States that he has not drunken in 8 days. He did not feel well for the first 2-3 days, he is feeling better now. Depression and insomnia: He is on Effexor XR 75 mg daily and Seroquel, the latter one increased from 100 mg to 200 mg a few days ago. Seroquel does not longer help with sleep. He found old Rx for Lunesta 3 mg and has been taken 1/2 yab + ZZZquil, sleeping better. He is requesting a Rx for Lunesta.  Anxiety: He resumed Clonazepam 0.5 mg at night, he is not sure if it is helping.  Negative for suicidal thoughts. He did CBT in the past. He has good family support.   Observations/Objective: Patient sounds cheerful and well on the phone. I do not appreciate any SOB. Speech and thought processing are grossly intact. Patient reported vitals:  Assessment and Plan:  1. Insomnia, unspecified type Problem is not well controlled and getting worse. Marc Sanders seems to help, so it will be prescribed. Good sleep hygiene.  - Eszopiclone 3 MG TABS; Take 1 tablet (3 mg total) by mouth at bedtime. Take immediately before  bedtime  Dispense: 30 tablet; Refill: 0  2. Depression, major, recurrent, moderate (HCC) Problem seems to be well controlled. Recommend considering CBT. No changes in current management.  3. Alcohol abuse Has been sobered for 8 days. He thinks he may not need to attend AA meeting for now, he has done so in the past.Recommend considering it.  4. Anxiety disorder, unspecified type Recommend decreasing dose of Clonazepam 0.5 mg from 1 tab to 1/2 tab. No changes in Effexor dose.  Follow Up Instructions:  Return in about 4 weeks (around 09/09/2020) for 3-4 weeks for insomnia,depression,and anxiety.  I did not refer this patient for an OV in the next 24 hours for this/these issue(s).  I discussed the assessment and treatment plan with the patient. Marc Sanders was provided an opportunity to ask questions and all were answered. He agreed with the plan and demonstrated an understanding of the instructions.   The patient was advised to call back or seek an in-person evaluation if the symptoms worsen or if the condition fails to improve as anticipated.  I provided 14 minutes of non-face-to-face time during this encounter.   Akyia Borelli [place], MD

## 2020-08-17 ENCOUNTER — Telehealth: Payer: 59 | Admitting: Family Medicine

## 2020-08-18 ENCOUNTER — Other Ambulatory Visit: Payer: Self-pay | Admitting: Family Medicine

## 2020-09-09 ENCOUNTER — Other Ambulatory Visit: Payer: Self-pay | Admitting: Family Medicine

## 2020-09-09 DIAGNOSIS — G4700 Insomnia, unspecified: Secondary | ICD-10-CM

## 2020-09-11 ENCOUNTER — Other Ambulatory Visit: Payer: Self-pay | Admitting: Family Medicine

## 2020-09-22 ENCOUNTER — Other Ambulatory Visit: Payer: Self-pay | Admitting: Family Medicine

## 2020-10-02 ENCOUNTER — Other Ambulatory Visit: Payer: Self-pay | Admitting: Family Medicine

## 2020-10-02 DIAGNOSIS — F331 Major depressive disorder, recurrent, moderate: Secondary | ICD-10-CM

## 2020-11-04 ENCOUNTER — Other Ambulatory Visit: Payer: Self-pay | Admitting: Family Medicine

## 2020-11-04 DIAGNOSIS — G4700 Insomnia, unspecified: Secondary | ICD-10-CM

## 2020-11-04 NOTE — Telephone Encounter (Signed)
Last filled 12/20/19

## 2020-12-30 ENCOUNTER — Other Ambulatory Visit: Payer: Self-pay | Admitting: Family Medicine

## 2020-12-30 DIAGNOSIS — F331 Major depressive disorder, recurrent, moderate: Secondary | ICD-10-CM

## 2021-01-05 ENCOUNTER — Other Ambulatory Visit: Payer: Self-pay | Admitting: Family Medicine

## 2021-01-05 DIAGNOSIS — G4700 Insomnia, unspecified: Secondary | ICD-10-CM

## 2021-01-05 NOTE — Telephone Encounter (Signed)
Last filled 12/07/20

## 2021-02-01 ENCOUNTER — Other Ambulatory Visit: Payer: Self-pay | Admitting: Family Medicine

## 2021-02-01 DIAGNOSIS — I10 Essential (primary) hypertension: Secondary | ICD-10-CM

## 2021-02-03 ENCOUNTER — Telehealth: Payer: Self-pay | Admitting: Family Medicine

## 2021-02-03 DIAGNOSIS — G4700 Insomnia, unspecified: Secondary | ICD-10-CM

## 2021-02-03 NOTE — Telephone Encounter (Signed)
Last filled 01/06/21

## 2021-02-08 NOTE — Telephone Encounter (Signed)
Patient is calling and is scheduled to come in on 3/11 and wanted to see if he could get a couple pills called in until he has his appointment. Pt is asking for a call back. CB is [phone]

## 2021-02-09 ENCOUNTER — Other Ambulatory Visit: Payer: Self-pay | Admitting: Family Medicine

## 2021-02-09 DIAGNOSIS — G4700 Insomnia, unspecified: Secondary | ICD-10-CM

## 2021-02-09 MED ORDER — ESZOPICLONE 3 MG PO TABS: 3.0000 mg | ORAL_TABLET | Freq: Every day | ORAL | 0 refills | Status: DC

## 2021-02-09 NOTE — Telephone Encounter (Signed)
Rx sent. Thanks, BJ 

## 2021-02-09 NOTE — Telephone Encounter (Signed)
I left pt a voicemail letting him know that the Rx was sent into the pharmacy.

## 2021-02-11 ENCOUNTER — Other Ambulatory Visit: Payer: Self-pay

## 2021-02-12 ENCOUNTER — Ambulatory Visit: Payer: 59 | Admitting: Family Medicine

## 2021-02-12 ENCOUNTER — Ambulatory Visit (INDEPENDENT_AMBULATORY_CARE_PROVIDER_SITE_OTHER): Payer: 59

## 2021-02-12 ENCOUNTER — Encounter: Payer: Self-pay | Admitting: Family Medicine

## 2021-02-12 VITALS — BP 126/70 | HR 85 | Resp 16 | Ht 74.0 in | Wt 289.4 lb

## 2021-02-12 DIAGNOSIS — F419 Anxiety disorder, unspecified: Secondary | ICD-10-CM | POA: Diagnosis not present

## 2021-02-12 DIAGNOSIS — R7303 Prediabetes: Secondary | ICD-10-CM

## 2021-02-12 DIAGNOSIS — R0989 Other specified symptoms and signs involving the circulatory and respiratory systems: Secondary | ICD-10-CM

## 2021-02-12 DIAGNOSIS — R7401 Elevation of levels of liver transaminase levels: Secondary | ICD-10-CM

## 2021-02-12 DIAGNOSIS — E039 Hypothyroidism, unspecified: Secondary | ICD-10-CM

## 2021-02-12 DIAGNOSIS — F331 Major depressive disorder, recurrent, moderate: Secondary | ICD-10-CM

## 2021-02-12 DIAGNOSIS — F10182 Alcohol abuse with alcohol-induced sleep disorder: Secondary | ICD-10-CM

## 2021-02-12 DIAGNOSIS — I10 Essential (primary) hypertension: Secondary | ICD-10-CM | POA: Diagnosis not present

## 2021-02-12 DIAGNOSIS — E785 Hyperlipidemia, unspecified: Secondary | ICD-10-CM | POA: Diagnosis not present

## 2021-02-12 DIAGNOSIS — Z6833 Body mass index (BMI) 33.0-33.9, adult: Secondary | ICD-10-CM

## 2021-02-12 DIAGNOSIS — E66811 Obesity, class 1: Secondary | ICD-10-CM

## 2021-02-12 DIAGNOSIS — E6609 Other obesity due to excess calories: Secondary | ICD-10-CM

## 2021-02-12 LAB — BASIC METABOLIC PANEL
BUN: 18 mg/dL (ref 6–23)
CO2: 29 mEq/L (ref 19–32)
Calcium: 9.7 mg/dL (ref 8.4–10.5)
Chloride: 99 mEq/L (ref 96–112)
Creatinine, Ser: 1.42 mg/dL (ref 0.40–1.50)
GFR: 54.49 mL/min — ABNORMAL LOW (ref >60.00)
Glucose, Bld: 107 mg/dL — ABNORMAL HIGH (ref 70–99)
Potassium: 5.1 mEq/L (ref 3.5–5.1)
Sodium: 136 mEq/L (ref 135–145)

## 2021-02-12 LAB — LIPID PANEL
Cholesterol: 157 mg/dL (ref 0–200)
HDL: 70.5 mg/dL (ref >39.00)
LDL Cholesterol: 67 mg/dL (ref 0–99)
NonHDL: 86.45
Total CHOL/HDL Ratio: 2
Triglycerides: 96 mg/dL (ref 0.0–149.0)
VLDL: 19.2 mg/dL (ref 0.0–40.0)

## 2021-02-12 LAB — HEPATIC FUNCTION PANEL
ALT: 36 U/L (ref 0–53)
AST: 27 U/L (ref 0–37)
Albumin: 4.4 g/dL (ref 3.5–5.2)
Alkaline Phosphatase: 63 U/L (ref 39–117)
Bilirubin, Direct: 0.1 mg/dL (ref 0.0–0.3)
Total Bilirubin: 0.6 mg/dL (ref 0.2–1.2)
Total Protein: 7.7 g/dL (ref 6.0–8.3)

## 2021-02-12 LAB — TSH: TSH: 10.47 u[IU]/mL — ABNORMAL HIGH (ref 0.35–4.50)

## 2021-02-12 LAB — HEMOGLOBIN A1C: Hgb A1c MFr Bld: 6.1 % (ref 4.6–6.5)

## 2021-02-12 NOTE — Assessment & Plan Note (Signed)
Stable. Continue clonazepam 0.5 mg daily as needed, about 1 tablet/week. We discussed some side effects. I think he will benefit from CBT.

## 2021-02-12 NOTE — Assessment & Plan Note (Signed)
BP adequately controlled. Continue lisinopril 10 mg daily, propranolol 20 mg twice daily, and amlodipine 10 mg daily. Recommend monitoring BP regularly. Low-salt diet is also recommended.

## 2021-02-12 NOTE — Assessment & Plan Note (Signed)
A healthier lifestyle recommended for primary prevention of diabetes. Further recommendation will be given according to A1c result.

## 2021-02-12 NOTE — Assessment & Plan Note (Signed)
Last TSH was abnormal. For now continue levothyroxine 100 mcg daily, dose will be adjusted according to TSH.

## 2021-02-12 NOTE — Progress Notes (Signed)
HPI: [name] is a 60 y.o. male, who is here today for 6 months follow up.   He was last seen on 08/12/20, virtual visit.  No new problems since his last visit. Still having left knee pain and getting worse. He is following with orthopedics, decided to hold on knee replacement for now. Pain is exacerbated by prolonged walking/standing, mainly when he walks on concrete.  Throbbing/severe pain. It is alleviated by rest.  Hypertension: Currently he is on lisinopril 10 mg daily, propranolol 20 mg twice daily, and amlodipine 10 mg daily. He is not checking BP regularly. CKD: He has not noted gross hematuria, decreased urine output, or foam in urine.  Negative for severe/frequent headache, visual changes, chest pain, dyspnea, palpitation, focal weakness, or edema.  Lab Results  Component Value Date   CREATININE 1.13 04/15/2020   BUN 17 04/15/2020   NA 136 04/15/2020   K 4.1 04/15/2020   CL 98 04/15/2020   CO2 27 04/15/2020    Today I noted bibasilar fine rales. He has occasional cough, attributed to postnasal drainage. Negative for fever, change in appetite, or wheezing.  Anxiety and insomnia: He is taking Clonazepam 0.5 mg, which he takes occasionally, about once per weelk. He took 2 tabs of clonazepam 0.5 mg at bedtime to help him sleep while he did not have the Lunesta.  He is on Lunesta 3 mg daily at bedtime and Seroquel 200 mg daily. Depression and anxiety: He is also on Effexor XR 75 mg daily. He thinks he is doing well with current regimen. He is working full-time and dealing well with the stress.  Sleeping about 8-9 hours.  Sometimes he wakes up in the middle of the night but he is able to go back to sleep.  In his 20's he smoked marijuana, which helped with anxiety and sleep. He has tried CBD gummies. Depression screen [facility] 2/9 02/12/2021 04/15/2020 11/17/2016  Decreased Interest 1 1 3   Down, Depressed, Hopeless 1 0 3  PHQ - 2 Score 2 1 6   Altered  sleeping 0 0 3  Tired, decreased energy 0 0 3  Change in appetite 0 0 -  Feeling bad or failure about yourself  0 0 2  Trouble concentrating 0 0 1  Moving slowly or fidgety/restless 0 0 2  Suicidal thoughts 0 0 0  PHQ-9 Score 2 1 17   Difficult doing work/chores Not difficult at all Not difficult at all Very difficult   GAD 7 : Generalized Anxiety Score 02/13/2021 04/15/2020  Nervous, Anxious, on Edge 1 0  Control/stop worrying 0 0  Worry too much - different things 0 0  Trouble relaxing 0 0  Restless 0 0  Easily annoyed or irritable 1 1  Afraid - awful might happen 0 0  Total GAD 7 Score 2 1  Anxiety Difficulty Not difficult at all Not difficult at all   Hyperlipidemia: Currently he is on atorvastatin 40 mg daily. He has not been consistent with following a healthful diet. Not exercising regularly due to knee pain, so he has gained some weight.  Lab Results  Component Value Date   CHOL 172 10/23/2019   HDL 69.20 10/23/2019   LDLCALC 78 10/23/2019   TRIG 122.0 10/23/2019   CHOLHDL 2 10/23/2019   Prediabetes: Negative for polyphagia, polydipsia, polyuria.  Lab Results  Component Value Date   HGBA1C 5.9 04/15/2020   Elevated transaminases: He has not noted abdominal pain, nausea, vomiting, stool/urine color changes, or jaundice.  He is drinking at night, 2 shots of liquor or 40 cc of beer.  Lab Results  Component Value Date   ALT 67 (H) 04/15/2020   AST 46 (H) 04/15/2020   ALKPHOS 55 04/15/2020   BILITOT 0.5 04/15/2020   Hypothyroidism: Currently he is on levothyroxine 100 mcg daily.Last refills in 04/2020 #90/0. Lab Results  Component Value Date   TSH 9.93 (H) 04/15/2020   Review of Systems  Constitutional: Positive for fatigue. Negative for activity change, appetite change and fever.  HENT: Negative for mouth sores, nosebleeds, sore throat and trouble swallowing.   Gastrointestinal: Negative for abdominal pain, nausea and vomiting.  Genitourinary: Negative for  decreased urine volume, dysuria and hematuria.  Musculoskeletal: Positive for arthralgias and gait problem.  Neurological: Negative for syncope, facial asymmetry and weakness.  Psychiatric/Behavioral: Positive for sleep disturbance. Negative for confusion and hallucinations. The patient is nervous/anxious.   Rest of ROS, see pertinent positives sand negatives in HPI  Current Outpatient Medications on File Prior to Visit  Medication Sig Dispense Refill  . amLODipine (NORVASC) 10 MG tablet TAKE 1 TABLET (10 MG TOTAL) BY MOUTH DAILY 90 tablet 2  . atorvastatin (LIPITOR) 40 MG tablet Take 1 tablet (40 mg total) by mouth daily. 90 tablet 2  . clonazePAM (KLONOPIN) 0.5 MG tablet Take 0.5 tablets (0.25 mg total) by mouth daily as needed for anxiety. 15 tablet 1  . Eszopiclone 3 MG TABS Take 1 tablet (3 mg total) by mouth at bedtime. Take immediately before bedtime 30 tablet 0  . lisinopril (ZESTRIL) 10 MG tablet Take 1 tablet (10 mg total) by mouth daily. 90 tablet 2  . propranolol (INDERAL) 20 MG tablet TAKE ONE TABLET BY MOUTH TWO TIMES A DAY 60 tablet 5  . QUEtiapine (SEROQUEL) 200 MG tablet TAKE 1 TABLET (200 MG TOTAL) BY MOUTH AT BEDTIME. 90 tablet 1  . venlafaxine XR (EFFEXOR-XR) 75 MG 24 hr capsule TAKE ONE CAPSULE BY MOUTH ONCE A DAY WITH BREAKFAST 30 capsule 1   No current facility-administered medications on file prior to visit.   Past Medical History:  Diagnosis Date  . Anxiety   . Carpal tunnel syndrome of right wrist   . Depression   . Hypothyroidism    No Known Allergies  Social History   Socioeconomic History  . Marital status: Single    Spouse name: Not on file  . Number of children: Not on file  . Years of education: Not on file  . Highest education level: Not on file  Occupational History  . Not on file  Tobacco Use  . Smoking status: Former Smoker    Packs/day: 1.00    Years: 8.00    Pack years: 8.00    Types: Cigarettes    Quit date: 12/05/1986    Years since  quitting: 34.2  . Smokeless tobacco: Never Used  Substance and Sexual Activity  . Alcohol use: Yes    Alcohol/week: 7.0 standard drinks    Types: 7 Glasses of wine per week  . Drug use: No  . Sexual activity: Yes  Other Topics Concern  . Not on file  Social History Narrative  . Not on file   Social Determinants of Health   Financial Resource Strain: Not on file  Food Insecurity: Not on file  Transportation Needs: Not on file  Physical Activity: Not on file  Stress: Not on file  Social Connections: Not on file    Vitals:   02/12/21 1052  BP: 126/70  Pulse: 85  Resp: 16  SpO2: 97%   Wt Readings from Last 3 Encounters:  02/12/21 289 lb 6 oz (131.3 kg)  04/15/20 264 lb (119.7 kg)  12/12/18 272 lb (123.4 kg)   Body mass index is 37.15 kg/m.  Physical Exam Vitals and nursing note reviewed.  Constitutional:      General: He is not in acute distress.    Appearance: He is well-developed.  HENT:     Head: Normocephalic and atraumatic.     Mouth/Throat:     Mouth: Mucous membranes are dry.  Eyes:     Conjunctiva/sclera: Conjunctivae normal.  Cardiovascular:     Rate and Rhythm: Normal rate and regular rhythm.     Pulses:          Dorsalis pedis pulses are 2+ on the right side and 2+ on the left side.     Heart sounds: No murmur heard.   Pulmonary:     Effort: Pulmonary effort is normal. No respiratory distress.     Breath sounds: Normal breath sounds.  Abdominal:     Palpations: Abdomen is soft. There is no hepatomegaly or mass.     Tenderness: There is no abdominal tenderness.  Musculoskeletal:     Left knee: Decreased range of motion. Tenderness present.     Comments: Antalgic gait.  Lymphadenopathy:     Cervical: No cervical adenopathy.  Skin:    General: Skin is warm.     Findings: No erythema or rash.  Neurological:     Mental Status: He is alert and oriented to person, place, and time.     Cranial Nerves: No cranial nerve deficit.  Psychiatric:      Comments: Well groomed, good eye contact.   ASSESSMENT AND PLAN:  [name] was seen today for follow-up.  Orders Placed This Encounter  Procedures  . DG Chest 2 View  . Basic metabolic panel  . Hepatic function panel  . Hemoglobin A1c  . Lipid panel  . 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 Continue Lunesta 3 mg daily and good sleep hygiene. Recommend considering attending AA meetings.  Elevated transaminase level Strongly recommended stopping alcohol intake. Further recommendations according to LFT's results.  Chest rales CXR ordered today, further recommendations will be given accordingly. - DG Chest 2 View; Future  Hypertension, essential, benign BP adequately controlled. Continue lisinopril 10 mg daily, propranolol 20 mg twice daily, and amlodipine 10 mg daily. Recommend monitoring BP regularly. Low-salt diet is also recommended.  Hyperlipidemia Continue atorvastatin 40 mg daily. Further recommendation will be given according to lipid panel results.  Depression, major, recurrent, moderate (HCC) Problem seems to be stable. Continue Seroquel 200 mg daily  and Effexor XR 75 mg daily.  Anxiety disorder Stable. Continue clonazepam 0.5 mg daily as needed, about 1 tablet/week. We discussed some side  effects. I think he will benefit from CBT.  Hypothyroidism Last TSH was abnormal. For now continue levothyroxine 100 mcg daily, dose will be adjusted according to TSH.  Class 1 obesity with body mass index (BMI) of 33.0 to 33.9 in adult He has gained about 24 pounds since 04/2020. We discussed benefits of wt loss as well as adverse effects of obesity. Consistency with healthy diet and physical activity recommended.  Prediabetes A healthier lifestyle recommended for primary prevention of diabetes. Further recommendation will be given according to A1c result.  Spent 41 minutes with pt.  During this time history was obtained and documented, examination was performed, prior labs reviewed, and assessment/plan discussed.   Return in about 4 months (around 06/14/2021) for anxiety,insomnia,hypothyroidism,HTN.   Betty G. [place], MD  [facility]. [place] office.   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 Do not use My Chart to request refills or for acute issues that need immediate attention.   Strongly recommend stopping alcohol intake. No changes in rest of your medications.  If your liver function is still abnormal, I will consider liver imaging.  No changes in [place].  Please be sure medication list is accurate. If a new problem present, please set up appointment sooner than planned today.

## 2021-02-12 NOTE — Assessment & Plan Note (Signed)
Continue atorvastatin 40 mg daily. Further recommendation will be given according to lipid panel results. 

## 2021-02-12 NOTE — Assessment & Plan Note (Signed)
Problem seems to be stable. Continue Seroquel 200 mg daily and Effexor XR 75 mg daily.

## 2021-02-12 NOTE — Patient Instructions (Addendum)
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 Do not use My Chart to request refills or for acute issues that need immediate attention.   Strongly recommend stopping alcohol intake. No changes in rest of your medications.  If your liver function is still abnormal, I will consider liver imaging.  No changes in [place].  Please be sure medication list is accurate. If a new problem present, please set up appointment sooner than planned today.

## 2021-02-12 NOTE — Assessment & Plan Note (Signed)
He has gained about 24 pounds since 04/2020. We discussed benefits of wt loss as well as adverse effects of obesity. Consistency with healthy diet and physical activity recommended.

## 2021-02-13 MED ORDER — LEVOTHYROXINE SODIUM 100 MCG PO TABS: 100.0000 ug | ORAL_TABLET | Freq: Every day | ORAL | 0 refills | Status: DC

## 2021-02-18 ENCOUNTER — Other Ambulatory Visit: Payer: Self-pay | Admitting: Family Medicine

## 2021-02-18 DIAGNOSIS — I10 Essential (primary) hypertension: Secondary | ICD-10-CM

## 2021-03-02 ENCOUNTER — Other Ambulatory Visit: Payer: Self-pay | Admitting: Family Medicine

## 2021-03-02 DIAGNOSIS — F331 Major depressive disorder, recurrent, moderate: Secondary | ICD-10-CM

## 2021-03-02 NOTE — Telephone Encounter (Signed)
Last filled 11/12/20 Last OV 02/12/21 Next OV 06/16/21

## 2021-03-08 ENCOUNTER — Other Ambulatory Visit: Payer: Self-pay | Admitting: Family Medicine

## 2021-03-08 DIAGNOSIS — G4700 Insomnia, unspecified: Secondary | ICD-10-CM

## 2021-03-08 NOTE — Telephone Encounter (Signed)
Last filled 02/09/21

## 2021-04-21 ENCOUNTER — Other Ambulatory Visit: Payer: Self-pay | Admitting: Family Medicine

## 2021-05-04 ENCOUNTER — Other Ambulatory Visit: Payer: Self-pay | Admitting: Family Medicine

## 2021-05-04 DIAGNOSIS — E785 Hyperlipidemia, unspecified: Secondary | ICD-10-CM

## 2021-05-10 ENCOUNTER — Other Ambulatory Visit: Payer: Self-pay | Admitting: Family Medicine

## 2021-05-10 DIAGNOSIS — I10 Essential (primary) hypertension: Secondary | ICD-10-CM

## 2021-05-10 DIAGNOSIS — E039 Hypothyroidism, unspecified: Secondary | ICD-10-CM

## 2021-05-17 ENCOUNTER — Other Ambulatory Visit: Payer: Self-pay | Admitting: Family Medicine

## 2021-05-17 DIAGNOSIS — I10 Essential (primary) hypertension: Secondary | ICD-10-CM

## 2021-06-16 ENCOUNTER — Other Ambulatory Visit: Payer: Self-pay

## 2021-06-16 ENCOUNTER — Ambulatory Visit: Payer: 59 | Admitting: Family Medicine

## 2021-06-16 ENCOUNTER — Encounter: Payer: Self-pay | Admitting: Family Medicine

## 2021-06-16 VITALS — BP 128/70 | HR 78 | Resp 16 | Ht 74.0 in | Wt 288.0 lb

## 2021-06-16 DIAGNOSIS — F331 Major depressive disorder, recurrent, moderate: Secondary | ICD-10-CM

## 2021-06-16 DIAGNOSIS — I10 Essential (primary) hypertension: Secondary | ICD-10-CM

## 2021-06-16 DIAGNOSIS — G4700 Insomnia, unspecified: Secondary | ICD-10-CM | POA: Diagnosis not present

## 2021-06-16 DIAGNOSIS — F419 Anxiety disorder, unspecified: Secondary | ICD-10-CM

## 2021-06-16 DIAGNOSIS — E039 Hypothyroidism, unspecified: Secondary | ICD-10-CM

## 2021-06-16 LAB — BASIC METABOLIC PANEL
BUN: 13 mg/dL (ref 6–23)
CO2: 27 mEq/L (ref 19–32)
Calcium: 9.6 mg/dL (ref 8.4–10.5)
Chloride: 104 mEq/L (ref 96–112)
Creatinine, Ser: 1.14 mg/dL (ref 0.40–1.50)
GFR: 70.76 mL/min (ref >60.00)
Glucose, Bld: 110 mg/dL — ABNORMAL HIGH (ref 70–99)
Potassium: 4.6 mEq/L (ref 3.5–5.1)
Sodium: 139 mEq/L (ref 135–145)

## 2021-06-16 LAB — TSH: TSH: 0.01 u[IU]/mL — ABNORMAL LOW (ref 0.35–5.50)

## 2021-06-16 NOTE — Patient Instructions (Signed)
A few things to remember from today's visit:   Hypertension, essential, benign  Insomnia, unspecified type  Hypothyroidism, unspecified type  Depression, major, recurrent, moderate (HCC)  Anxiety disorder, unspecified type  If you need refills please call your pharmacy. Do not use My Chart to request refills or for acute issues that need immediate attention.   No changes today. Congratulations for stopping alcohol intake.  Please be sure medication list is accurate. If a new problem present, please set up appointment sooner than planned today.

## 2021-06-16 NOTE — Progress Notes (Signed)
HPI: [name] is a 60 y.o. male, who is here today for 4 months follow up.   He was last seen on 02/12/21.  Hypothyroidism: He is on Levothyroxine 100 mcg day. He is taking medication as instructed. He has not noted changes in bowel habits,tremor,cold/heat intolerance.  Lab Results  Component Value Date   TSH 10.47 (H) 02/12/2021   HTN: He is not checking BP. He is on Amlodipine 10 mg daily,Lisinopril 10 mg daily,and Propranolol 20 mg bid. Negative for severe/frequent headache, chest pain, dyspnea, palpitation,focal weakness, or edema.  Lab Results  Component Value Date   CREATININE 1.42 02/12/2021   BUN 18 02/12/2021   NA 136 02/12/2021   K 5.1 02/12/2021   CL 99 02/12/2021   CO2 29 02/12/2021   He is not exercising regularly due to knee pain, following with ortho.  Insomnia:He is on Eszopiclone 3 mg daily. Seroquel has also helped with sleep. Sleeping about 7-8 hours.  He has not drunken alcohol for the past couple months. He is sleeping well and feels better. He is on Seroquel 200 mg daily and Effexor XL 75 mg daily for depression and anxiety. Clonazepam 0.5 mg daily as needed, not frequent. Tolerating medications well.  Review of Systems  Constitutional:  Negative for activity change, appetite change and fever.  HENT:  Negative for mouth sores, nosebleeds, sore throat and trouble swallowing.   Eyes:  Negative for redness and visual disturbance.  Respiratory:  Negative for cough and wheezing.   Gastrointestinal:  Negative for abdominal pain, nausea and vomiting.  Genitourinary:  Negative for decreased urine volume, dysuria and hematuria.  Musculoskeletal:  Positive for arthralgias.  Skin:  Negative for pallor and rash.  Neurological:  Negative for syncope, facial asymmetry and weakness.  Psychiatric/Behavioral:  Positive for sleep disturbance. Negative for confusion.   Rest of ROS, see pertinent positives sand negatives in HPI  Current  Outpatient Medications on File Prior to Visit  Medication Sig Dispense Refill   amLODipine (NORVASC) 10 MG tablet TAKE 1 TABLET (10 MG TOTAL) BY MOUTH DAILY 30 tablet 2   atorvastatin (LIPITOR) 40 MG tablet TAKE 1 TABLET (40 MG TOTAL) BY MOUTH DAILY. 30 tablet 2   clonazePAM (KLONOPIN) 0.5 MG tablet 1/2-1 tab a couple times per week as needed for anxiety. 15 tablet 1   lisinopril (ZESTRIL) 10 MG tablet TAKE 1 TABLET BY MOUTH BY DAILY. 90 tablet 2   QUEtiapine (SEROQUEL) 200 MG tablet TAKE 1 TABLET (200 MG TOTAL) BY MOUTH AT BEDTIME. 30 tablet 1   venlafaxine XR (EFFEXOR-XR) 75 MG 24 hr capsule TAKE ONE CAPSULE BY MOUTH ONCE A DAY WITH BREAKFAST 30 capsule 5   No current facility-administered medications on file prior to visit.   Past Medical History:  Diagnosis Date   Anxiety    Carpal tunnel syndrome of right wrist    Depression    Hypothyroidism    No Known Allergies  Social History   Socioeconomic History   Marital status: Single    Spouse name: Not on file   Number of children: Not on file   Years of education: Not on file   Highest education level: Not on file  Occupational History   Not on file  Tobacco Use   Smoking status: Former    Packs/day: 1.00    Years: 8.00    Pack years: 8.00    Types: Cigarettes    Quit date: 12/05/1986    Years since  quitting: 34.5   Smokeless tobacco: Never  Substance and Sexual Activity   Alcohol use: Yes    Alcohol/week: 7.0 standard drinks    Types: 7 Glasses of wine per week   Drug use: No   Sexual activity: Yes  Other Topics Concern   Not on file  Social History Narrative   Not on file   Social Determinants of Health   Financial Resource Strain: Not on file  Food Insecurity: Not on file  Transportation Needs: Not on file  Physical Activity: Not on file  Stress: Not on file  Social Connections: Not on file   Vitals:   06/16/21 1124  BP: 128/70  Pulse: 78  Resp: 16  SpO2: 98%   Body mass index is 36.98  kg/m.  Physical Exam Nursing note reviewed.  Constitutional:      General: He is not in acute distress.    Appearance: He is well-developed.  HENT:     Head: Normocephalic and atraumatic.  Eyes:     Conjunctiva/sclera: Conjunctivae normal.  Cardiovascular:     Rate and Rhythm: Normal rate and regular rhythm.     Pulses:          Dorsalis pedis pulses are 2+ on the right side and 2+ on the left side.     Heart sounds: No murmur heard. Pulmonary:     Effort: Pulmonary effort is normal. No respiratory distress.     Breath sounds: Normal breath sounds.  Abdominal:     Palpations: Abdomen is soft. There is no hepatomegaly or mass.     Tenderness: There is no abdominal tenderness.  Lymphadenopathy:     Cervical: No cervical adenopathy.  Skin:    General: Skin is warm.     Findings: No erythema or rash.  Neurological:     Mental Status: He is alert and oriented to person, place, and time.     Cranial Nerves: No cranial nerve deficit.     Comments: Antalgic gait, not assisted.  Psychiatric:     Comments: Well groomed, good eye contact.   ASSESSMENT AND PLAN:  [name] was seen today for 4 months follow-up.  Orders Placed This Encounter  Procedures   Basic metabolic panel   TSH   Lab Results  Component Value Date   TSH 0.01 (L) 06/16/2021   Lab Results  Component Value Date   CREATININE 1.14 06/16/2021   BUN 13 06/16/2021   NA 139 06/16/2021   K 4.6 06/16/2021   CL 104 06/16/2021   CO2 27 06/16/2021   Insomnia, unspecified type Stable. Good sleep hygiene. Continue Eszopiclone 3 mg daily at bedtime and Seroquel 200 mg daily.  -     Eszopiclone 3 MG TABS; TAKE 1 TABLET BY MOUTH AT BEDTIME. TAKE IMMEDIATELY BEFORE BEDTIME  Hypertension, essential, benign BP adequately controlled. Continue Propranolol, Lisinopril,and Amlodipine same dose. Low salt diet. Monitor BP regularly.  -     propranolol (INDERAL) 20 MG tablet; Take 1 tablet (20 mg  total) by mouth 2 (two) times daily.  Hypothyroidism, unspecified type Last TSH still not at goal. Continue Levothyroxine 100 mcg daily, dose will be adjusted if needed according to TSH result.  Depression, major, recurrent, moderate (HCC) Stable. Continue Seroquel 200 mg daily and Effexor XL 75 mg daily. Congratulated for decreasing/stopping alcohol intake.  Anxiety disorder, unspecified type Stable. Continue Effexor XL 75 mg daily. He is taking Clonazepam 0.5 mg daily prn, not very frequent. [place] controlled subs report  reviewed.  Return in about 22 weeks (around 11/17/2021).   Chevi Lim G. [place], MD  [facility]. [place] office.

## 2021-06-20 ENCOUNTER — Encounter: Payer: Self-pay | Admitting: Family Medicine

## 2021-06-20 MED ORDER — PROPRANOLOL HCL 20 MG PO TABS: 20.0000 mg | ORAL_TABLET | Freq: Two times a day (BID) | ORAL | 1 refills | Status: DC

## 2021-06-20 MED ORDER — LEVOTHYROXINE SODIUM 88 MCG PO TABS: 88.0000 ug | ORAL_TABLET | Freq: Every day | ORAL | 0 refills | Status: DC

## 2021-06-20 MED ORDER — ESZOPICLONE 3 MG PO TABS
ORAL_TABLET | ORAL | 3 refills | Status: DC
Start: 2021-06-20 — End: 2021-11-08

## 2021-06-21 ENCOUNTER — Other Ambulatory Visit: Payer: Self-pay | Admitting: Family Medicine

## 2021-08-03 ENCOUNTER — Other Ambulatory Visit: Payer: Self-pay | Admitting: Family Medicine

## 2021-08-03 DIAGNOSIS — E785 Hyperlipidemia, unspecified: Secondary | ICD-10-CM

## 2021-08-11 ENCOUNTER — Other Ambulatory Visit: Payer: Self-pay | Admitting: Family Medicine

## 2021-08-11 DIAGNOSIS — E039 Hypothyroidism, unspecified: Secondary | ICD-10-CM

## 2021-08-11 DIAGNOSIS — I10 Essential (primary) hypertension: Secondary | ICD-10-CM

## 2021-08-11 MED ORDER — LEVOTHYROXINE SODIUM 88 MCG PO TABS: 88.0000 ug | ORAL_TABLET | Freq: Every day | ORAL | 0 refills | Status: DC

## 2021-09-02 ENCOUNTER — Other Ambulatory Visit: Payer: Self-pay | Admitting: Family Medicine

## 2021-09-02 DIAGNOSIS — F331 Major depressive disorder, recurrent, moderate: Secondary | ICD-10-CM

## 2021-10-03 IMAGING — DX DG CHEST 2V
2 series · 2 of 2 positions shown · non-contrast
Comparison: None.

CLINICAL DATA: Fine rales at the lung bases.  No symptoms reported.

EXAM:
CHEST - 2 VIEW

[chest pa]
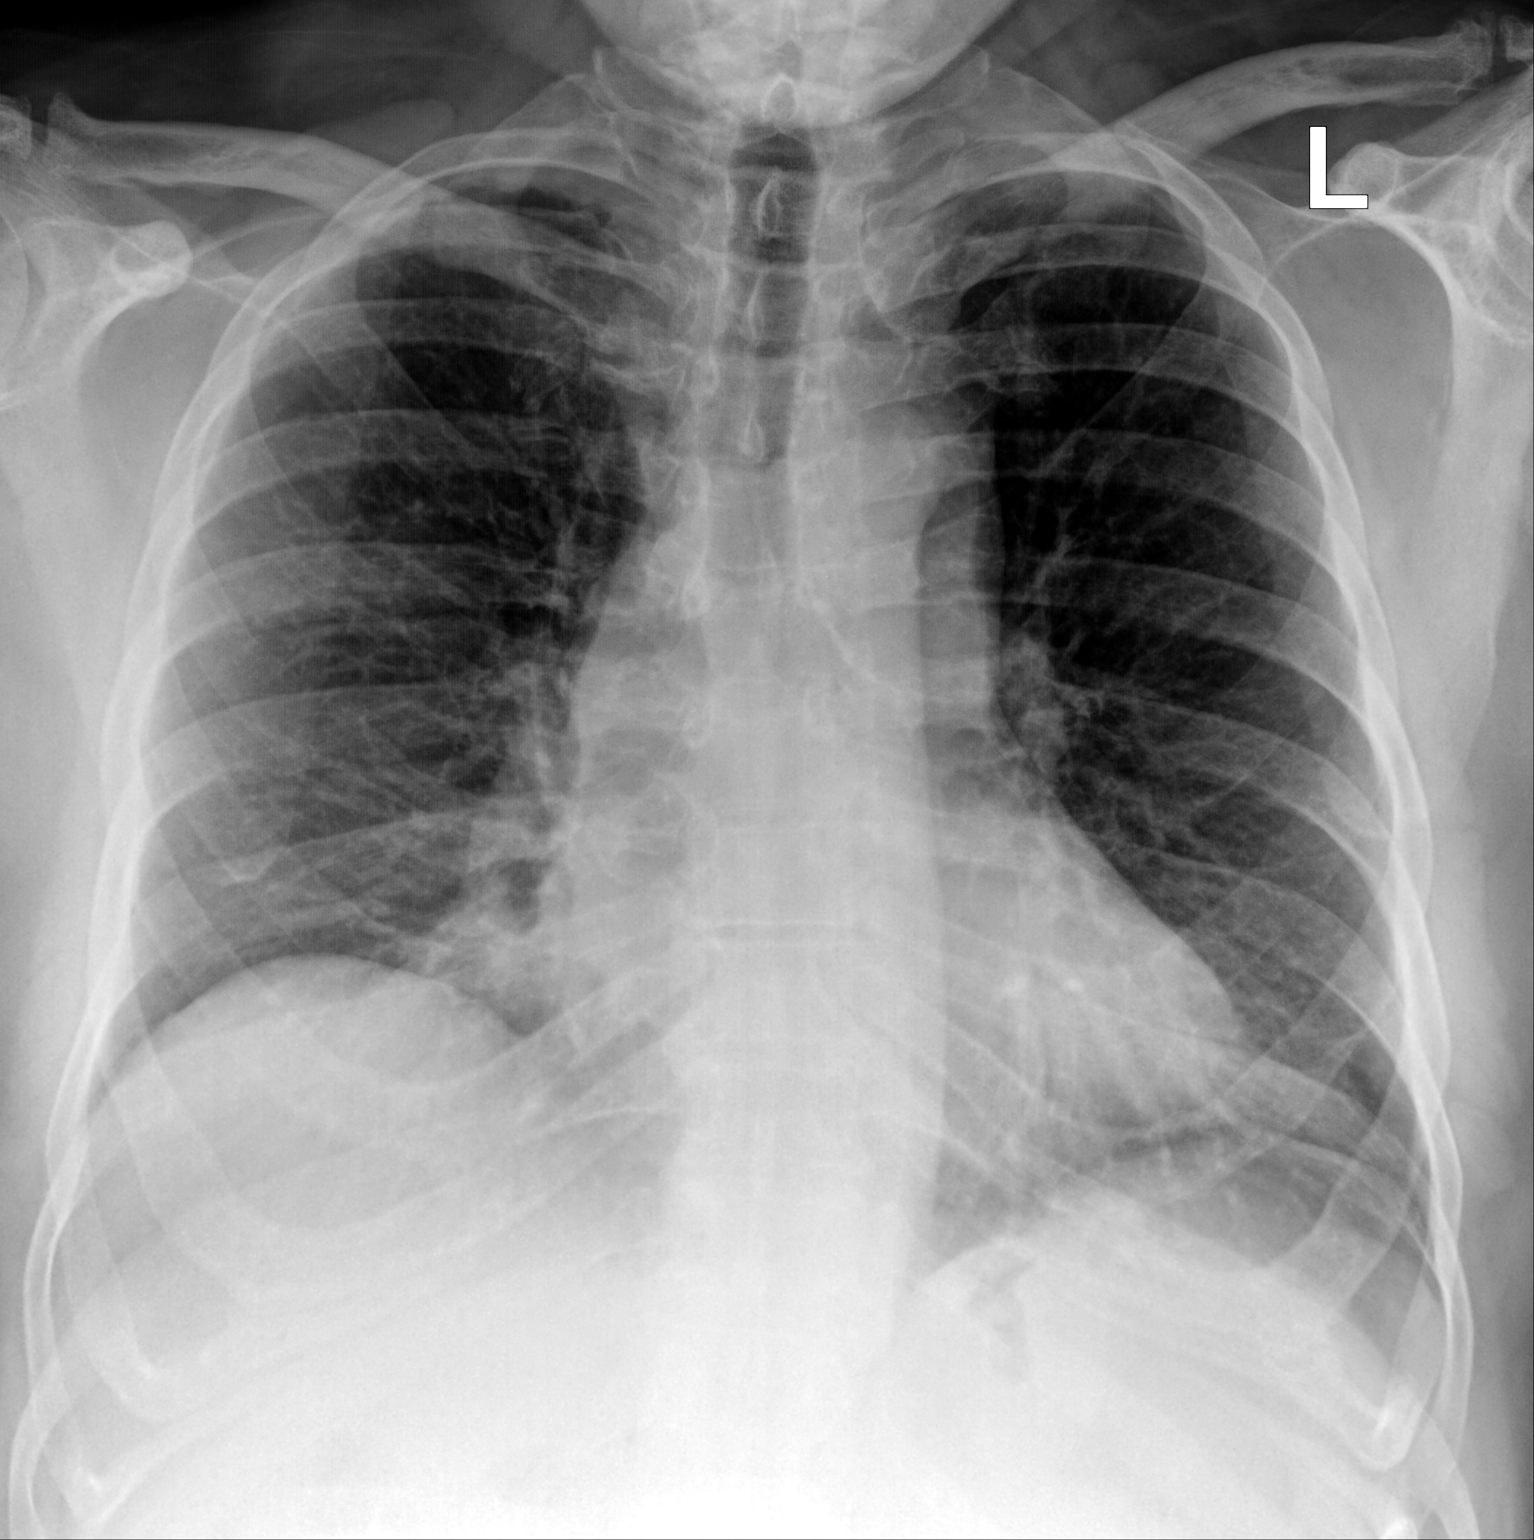

[chest lat]
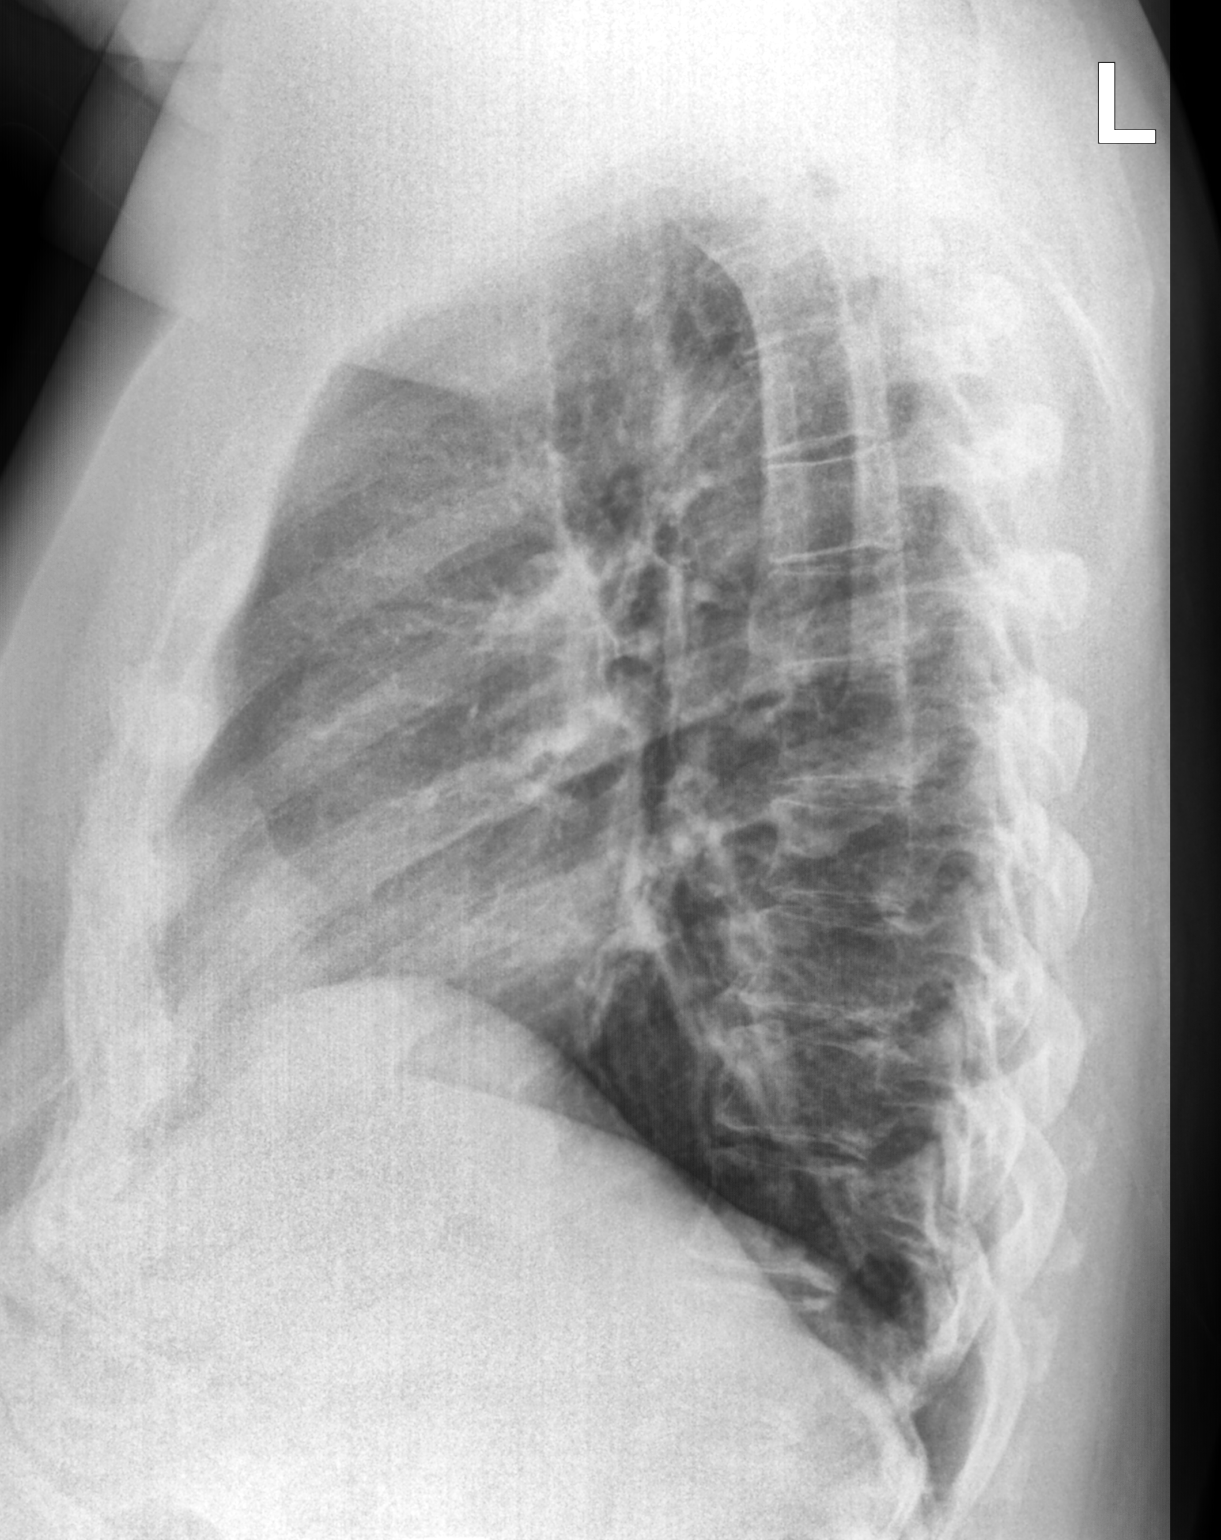

[2 of 2 positions shown; findings below may reference images not displayed]

FINDINGS: Normal heart size. Normal mediastinal contour. No pneumothorax. No
pleural effusion. Mild biapical pleural-parenchymal scarring. No
pulmonary edema. No acute consolidative airspace disease.
IMPRESSION: No active cardiopulmonary disease.

## 2021-11-08 ENCOUNTER — Other Ambulatory Visit: Payer: Self-pay | Admitting: Family Medicine

## 2021-11-08 DIAGNOSIS — G4700 Insomnia, unspecified: Secondary | ICD-10-CM

## 2021-11-08 NOTE — Telephone Encounter (Signed)
Last filled 11.7.22

## 2021-11-17 ENCOUNTER — Ambulatory Visit: Payer: 59 | Admitting: Family Medicine

## 2021-11-23 ENCOUNTER — Other Ambulatory Visit: Payer: Self-pay | Admitting: Family Medicine

## 2021-11-23 NOTE — Progress Notes (Signed)
HPI: Marc Sanders is a 60 y.o. male, who is here today to follow on recent visit.  Since his last visit he had left TKR, doing well. No alcohol since 03/2021.  Insomnia,anxiety,and depression: He is on Eszopiclone 3 mg daily at bedtime. Seroquel 200 mg at bedtime and Effexor XR 75 mg daily. Medication helping with depression and anxiety. He takes Clonazepam 0.5 mg seldom, usually 2 tabs at night, 2 times in the past month.Clonazepam last filled on 04/01/21.  Sleeping about 8 hours. Feels rested next day.  Stopped alcohol intake.  Hypertension:  Medications:Propranolol 20 mg bid,Amlodipine 10 mg daily,and Lisinopril 10 mg daily.  BP readings at home:Not checking. Side effects:None.  Negative for unusual or severe headache, visual changes, exertional chest pain, dyspnea,  focal weakness, or edema.  Lab Results  Component Value Date   CREATININE 1.14 06/16/2021   BUN 13 06/16/2021   NA 139 06/16/2021   K 4.6 06/16/2021   CL 104 06/16/2021   CO2 27 06/16/2021   Hypothyroidism: Last TSH mildly abnormal, 0.01 in 06/2021. Levothyroxine decreased from 100 mcg to 88 mcg daily. Negative for palpitations,diarrhea, abnormal wt loss,tremor,cold/heat intolerance.  Prediabetes: Negative for polydipsia,polyuria, or polyphagia. HgA1C 6.1 in 02/2021.  Review of Systems  Constitutional:  Negative for activity change, appetite change and fever.  HENT:  Negative for nosebleeds and sore throat.   Respiratory:  Negative for cough and wheezing.   Gastrointestinal:  Negative for abdominal pain, nausea and vomiting.  Genitourinary:  Negative for decreased urine volume and hematuria.  Musculoskeletal:  Positive for arthralgias.  Skin:  Negative for pallor and rash.  Neurological:  Negative for syncope, facial asymmetry and weakness.  Psychiatric/Behavioral:  Negative for confusion and hallucinations.   Rest see pertinent positives and negatives per HPI.  Current Outpatient  Medications on File Prior to Visit  Medication Sig Dispense Refill   amLODipine (NORVASC) 10 MG tablet TAKE 1 TABLET (10 MG TOTAL) BY MOUTH DAILY 90 tablet 1   atorvastatin (LIPITOR) 40 MG tablet TAKE 1 TABLET (40 MG TOTAL) BY MOUTH DAILY. 90 tablet 2   Eszopiclone 3 MG TABS TAKE 1 TABLET BY MOUTH AT BEDTIME. TAKE IMMEDIATELY BEFORE BEDTIME 30 tablet 3   levothyroxine (SYNTHROID) 88 MCG tablet Take 1 tablet (88 mcg total) by mouth daily. 90 tablet 0   lisinopril (ZESTRIL) 10 MG tablet TAKE 1 TABLET BY MOUTH BY DAILY. 90 tablet 2   propranolol (INDERAL) 20 MG tablet Take 1 tablet (20 mg total) by mouth 2 (two) times daily. 180 tablet 1   QUEtiapine (SEROQUEL) 200 MG tablet TAKE 1 TABLET (200 MG TOTAL) BY MOUTH AT BEDTIME. 30 tablet 5   venlafaxine XR (EFFEXOR-XR) 75 MG 24 hr capsule TAKE ONE CAPSULE BY MOUTH ONCE A DAY WITH BREAKFAST 90 capsule 3   clonazePAM (KLONOPIN) 0.5 MG tablet TAKE 1/2-1 TAB BY MOUTH A COUPLE TIMES PER WEEK AS NEEDED FOR ANXIETY. 15 tablet 1   No current facility-administered medications on file prior to visit.    Past Medical History:  Diagnosis Date   Anxiety    Carpal tunnel syndrome of right wrist    Depression    Hypothyroidism    No Known Allergies  Social History   Socioeconomic History   Marital status: Single    Spouse name: Not on file   Number of children: Not on file   Years of education: Not on file   Highest education level: Not on file  Occupational History  Not on file  Tobacco Use   Smoking status: Former    Packs/day: 1.00    Years: 8.00    Pack years: 8.00    Types: Cigarettes    Quit date: 12/05/1986    Years since quitting: 34.9   Smokeless tobacco: Never  Substance and Sexual Activity   Alcohol use: Yes    Alcohol/week: 7.0 standard drinks    Types: 7 Glasses of wine per week   Drug use: No   Sexual activity: Yes  Other Topics Concern   Not on file  Social History Narrative   Not on file   Social Determinants of Health    Financial Resource Strain: Not on file  Food Insecurity: Not on file  Transportation Needs: Not on file  Physical Activity: Not on file  Stress: Not on file  Social Connections: Not on file    Vitals:   11/24/21 1138  BP: 124/80  Pulse: 79  Resp: 16  SpO2: 98%   Body mass index is 35.84 kg/m.  Physical Exam Vitals and nursing note reviewed.  Constitutional:      General: He is not in acute distress.    Appearance: He is well-developed.  HENT:     Head: Normocephalic and atraumatic.     Mouth/Throat:     Mouth: Mucous membranes are moist.     Pharynx: Oropharynx is clear.  Eyes:     Conjunctiva/sclera: Conjunctivae normal.  Cardiovascular:     Rate and Rhythm: Normal rate and regular rhythm.     Pulses:          Dorsalis pedis pulses are 2+ on the right side and 2+ on the left side.     Heart sounds: No murmur heard. Pulmonary:     Effort: Pulmonary effort is normal. No respiratory distress.     Breath sounds: Normal breath sounds.  Abdominal:     Palpations: Abdomen is soft. There is no hepatomegaly or mass.     Tenderness: There is no abdominal tenderness.  Lymphadenopathy:     Cervical: No cervical adenopathy.  Skin:    General: Skin is warm.     Findings: No erythema or rash.  Neurological:     Mental Status: He is alert and oriented to person, place, and time.     Cranial Nerves: No cranial nerve deficit.     Gait: Gait normal.  Psychiatric:     Comments: Well groomed, good eye contact.   ASSESSMENT AND PLAN:  Mr.Jaedin was seen today for follow-up.  Diagnoses and all orders for this visit: Orders Placed This Encounter  Procedures   Basic metabolic panel   Hemoglobin A1c   TSH   T4, free   Lab Results  Component Value Date   TSH 4.35 11/24/2021   Lab Results  Component Value Date   HGBA1C 6.1 11/24/2021   Lab Results  Component Value Date   CREATININE 1.16 11/24/2021   BUN 14 11/24/2021   NA 139 11/24/2021   K 3.9 11/24/2021   CL  104 11/24/2021   CO2 29 11/24/2021   Prediabetes A healthy life style encouraged for diabetes prevention.  Insomnia, unspecified type Problem is well controlled. No changes in Eszopiclone dose. Good sleep hygiene. Med last filled 11/08/21.  Hypertension, essential, benign BP adequately controlled. Continue current management: Propranolol,Amlodipine,and Lisinopril same dose. DASH/low salt diet recommended. Monitor BP at home.  Hypothyroidism, unspecified type Last TSH abnormal. Continue Levothyroxine 88 mcg daily, will adjust dose if needed.  Depression, major, recurrent, moderate (HCC) Stable. Continue Effexor XR 75 mg daily and Seroquel 200 mg daily.  Anxiety disorder, unspecified type Problem is stable. Continue Clonazepam 0.5 mg 0.5 mg daily as needed, no more than 15 tabs per month. No changes in Effexor or Seroquel dose.  Return in about 6 months (around 05/25/2022) for cpe.  Kara Mierzejewski G. [place], MD  [facility]. [place] office.

## 2021-11-23 NOTE — Telephone Encounter (Signed)
Last filled 04/01/21 Has f/u appt tomorrow, 12/21

## 2021-11-24 ENCOUNTER — Encounter: Payer: Self-pay | Admitting: Family Medicine

## 2021-11-24 ENCOUNTER — Ambulatory Visit (INDEPENDENT_AMBULATORY_CARE_PROVIDER_SITE_OTHER): Payer: 59 | Admitting: Family Medicine

## 2021-11-24 VITALS — BP 124/80 | HR 79 | Resp 16 | Ht 74.0 in | Wt 279.1 lb

## 2021-11-24 DIAGNOSIS — F419 Anxiety disorder, unspecified: Secondary | ICD-10-CM

## 2021-11-24 DIAGNOSIS — R7303 Prediabetes: Secondary | ICD-10-CM | POA: Diagnosis not present

## 2021-11-24 DIAGNOSIS — E039 Hypothyroidism, unspecified: Secondary | ICD-10-CM

## 2021-11-24 DIAGNOSIS — G4700 Insomnia, unspecified: Secondary | ICD-10-CM

## 2021-11-24 DIAGNOSIS — I10 Essential (primary) hypertension: Secondary | ICD-10-CM

## 2021-11-24 DIAGNOSIS — F331 Major depressive disorder, recurrent, moderate: Secondary | ICD-10-CM

## 2021-11-24 LAB — HEMOGLOBIN A1C: Hgb A1c MFr Bld: 6.1 % (ref 4.6–6.5)

## 2021-11-24 LAB — BASIC METABOLIC PANEL
BUN: 14 mg/dL (ref 6–23)
CO2: 29 mEq/L (ref 19–32)
Calcium: 9.7 mg/dL (ref 8.4–10.5)
Chloride: 104 mEq/L (ref 96–112)
Creatinine, Ser: 1.16 mg/dL (ref 0.40–1.50)
GFR: 69.08 mL/min (ref >60.00)
Glucose, Bld: 86 mg/dL (ref 70–99)
Potassium: 3.9 mEq/L (ref 3.5–5.1)
Sodium: 139 mEq/L (ref 135–145)

## 2021-11-24 LAB — T4, FREE: Free T4: 0.83 ng/dL (ref 0.60–1.60)

## 2021-11-24 LAB — TSH: TSH: 4.35 u[IU]/mL (ref 0.35–5.50)

## 2021-11-24 NOTE — Patient Instructions (Signed)
A few things to remember from today's visit:   Hypertension, essential, benign - Plan: Basic metabolic panel  Depression, major, recurrent, moderate (HCC)  Insomnia, unspecified type  Hypothyroidism, unspecified type - Plan: TSH, T4, free  Anxiety disorder, unspecified type  Prediabetes - Plan: Hemoglobin A1c  If you need refills please call your pharmacy. Do not use My Chart to request refills or for acute issues that need immediate attention.   No changes today.  Please be sure medication list is accurate. If a new problem present, please set up appointment sooner than planned today.

## 2021-11-25 ENCOUNTER — Encounter: Payer: Self-pay | Admitting: Family Medicine

## 2021-11-25 MED ORDER — LEVOTHYROXINE SODIUM 88 MCG PO TABS: 88.0000 ug | ORAL_TABLET | Freq: Every day | ORAL | 2 refills | Status: AC

## 2021-11-25 MED ORDER — QUETIAPINE FUMARATE 200 MG PO TABS: 200.0000 mg | ORAL_TABLET | Freq: Every day | ORAL | 1 refills | Status: DC

## 2022-01-24 ENCOUNTER — Other Ambulatory Visit: Payer: Self-pay | Admitting: Family Medicine

## 2022-01-24 DIAGNOSIS — I10 Essential (primary) hypertension: Secondary | ICD-10-CM

## 2022-01-31 ENCOUNTER — Other Ambulatory Visit: Payer: Self-pay | Admitting: Family Medicine

## 2022-01-31 DIAGNOSIS — I10 Essential (primary) hypertension: Secondary | ICD-10-CM

## 2022-02-11 ENCOUNTER — Other Ambulatory Visit: Payer: Self-pay | Admitting: Family Medicine

## 2022-02-11 DIAGNOSIS — I10 Essential (primary) hypertension: Secondary | ICD-10-CM

## 2022-03-03 ENCOUNTER — Other Ambulatory Visit: Payer: Self-pay | Admitting: Family Medicine

## 2022-03-03 DIAGNOSIS — G4700 Insomnia, unspecified: Secondary | ICD-10-CM

## 2022-03-04 NOTE — Telephone Encounter (Signed)
-   last filled 02/04/22 ?- last OV 11/24/21 ?- due for follow up in 05/2022 ?

## 2022-04-11 ENCOUNTER — Encounter: Payer: Self-pay | Admitting: Family Medicine

## 2022-05-03 ENCOUNTER — Other Ambulatory Visit: Payer: Self-pay | Admitting: Family Medicine

## 2022-05-03 DIAGNOSIS — E785 Hyperlipidemia, unspecified: Secondary | ICD-10-CM

## 2022-06-17 ENCOUNTER — Other Ambulatory Visit: Payer: Self-pay | Admitting: Family Medicine

## 2022-07-04 ENCOUNTER — Other Ambulatory Visit: Payer: Self-pay | Admitting: Family Medicine

## 2022-07-04 DIAGNOSIS — G4700 Insomnia, unspecified: Secondary | ICD-10-CM

## 2022-07-04 NOTE — Telephone Encounter (Signed)
-   last filled 06/01/22 - last OV 11/2021

## 2022-07-28 ENCOUNTER — Other Ambulatory Visit: Payer: Self-pay | Admitting: Family Medicine

## 2022-07-28 DIAGNOSIS — G4700 Insomnia, unspecified: Secondary | ICD-10-CM

## 2022-08-02 ENCOUNTER — Other Ambulatory Visit: Payer: Self-pay | Admitting: Family Medicine

## 2022-08-02 DIAGNOSIS — I10 Essential (primary) hypertension: Secondary | ICD-10-CM

## 2022-08-02 DIAGNOSIS — G4700 Insomnia, unspecified: Secondary | ICD-10-CM

## 2022-08-05 ENCOUNTER — Other Ambulatory Visit: Payer: Self-pay | Admitting: Family Medicine

## 2022-08-05 DIAGNOSIS — G4700 Insomnia, unspecified: Secondary | ICD-10-CM

## 2022-08-09 ENCOUNTER — Other Ambulatory Visit: Payer: Self-pay | Admitting: Family Medicine

## 2022-08-09 DIAGNOSIS — G4700 Insomnia, unspecified: Secondary | ICD-10-CM

## 2022-08-11 ENCOUNTER — Other Ambulatory Visit: Payer: Self-pay | Admitting: Family Medicine

## 2022-08-11 DIAGNOSIS — G4700 Insomnia, unspecified: Secondary | ICD-10-CM

## 2022-08-18 ENCOUNTER — Telehealth: Payer: Self-pay | Admitting: Family Medicine

## 2022-08-18 NOTE — Telephone Encounter (Signed)
Pt calling checking on prescription for  Eszopiclone 3 MG TABS was sent to pharmacy

## 2022-08-25 ENCOUNTER — Other Ambulatory Visit: Payer: Self-pay | Admitting: Family Medicine

## 2022-08-25 DIAGNOSIS — F331 Major depressive disorder, recurrent, moderate: Secondary | ICD-10-CM

## 2022-08-26 ENCOUNTER — Other Ambulatory Visit: Payer: Self-pay | Admitting: Family Medicine

## 2022-08-26 DIAGNOSIS — G4700 Insomnia, unspecified: Secondary | ICD-10-CM

## 2022-09-13 ENCOUNTER — Other Ambulatory Visit: Payer: Self-pay | Admitting: Family Medicine

## 2022-09-13 DIAGNOSIS — G4700 Insomnia, unspecified: Secondary | ICD-10-CM

## 2022-09-26 ENCOUNTER — Other Ambulatory Visit: Payer: Self-pay | Admitting: Family Medicine

## 2022-09-26 DIAGNOSIS — F331 Major depressive disorder, recurrent, moderate: Secondary | ICD-10-CM

## 2022-10-04 ENCOUNTER — Other Ambulatory Visit: Payer: Self-pay | Admitting: Family Medicine

## 2022-10-04 DIAGNOSIS — F331 Major depressive disorder, recurrent, moderate: Secondary | ICD-10-CM

## 2022-10-06 ENCOUNTER — Other Ambulatory Visit: Payer: Self-pay | Admitting: Family Medicine

## 2022-10-06 DIAGNOSIS — G4700 Insomnia, unspecified: Secondary | ICD-10-CM

## 2022-10-06 DIAGNOSIS — F331 Major depressive disorder, recurrent, moderate: Secondary | ICD-10-CM

## 2022-10-06 NOTE — Telephone Encounter (Signed)
Pt called to request a refill for the:  enlafaxine XR (EFFEXOR-XR) 75 MG 24 hr capsule   LOV:  11/24/21  [place], [place] Phone: [phone]  Fax: [phone]

## 2022-10-07 MED ORDER — VENLAFAXINE HCL ER 75 MG PO CP24: 75.0000 mg | ORAL_CAPSULE | Freq: Every day | ORAL | 0 refills | Status: AC

## 2022-10-07 NOTE — Telephone Encounter (Signed)
Needs to schedule appt first.

## 2022-10-07 NOTE — Telephone Encounter (Signed)
Pt was scheduled for a CPE on 11/02/22.  Pt would like to know if MD would be willing to send one month of the Eszopiclone 3 MG TABS, as well as the Effexor.  Please advise.  [place], [place] Phone: [phone]  Fax: [phone]

## 2022-10-09 MED ORDER — ESZOPICLONE 3 MG PO TABS: ORAL_TABLET | ORAL | 3 refills | Status: AC

## 2022-10-19 ENCOUNTER — Telehealth: Payer: Self-pay | Admitting: Family Medicine

## 2022-10-19 MED ORDER — QUETIAPINE FUMARATE 200 MG PO TABS: 200.0000 mg | ORAL_TABLET | Freq: Every day | ORAL | 0 refills | Status: AC

## 2022-10-19 NOTE — Telephone Encounter (Signed)
[facility] call and stated pt need a refill on QUEtiapine (SEROQUEL) 200 MG tablet sent in .

## 2022-10-24 ENCOUNTER — Ambulatory Visit: Payer: 59 | Admitting: Family Medicine

## 2022-11-01 NOTE — Progress Notes (Deleted)
HPI:  [name] is a 61 y.o.male here today for his routine physical examination.  Last CPE: 04/15/20  Regular exercise 3 or more times per week: *** Following a healthy diet: ***  Chronic medical problems: ***  Immunization History  Administered Date(s) Administered   Influenza,inj,Quad PF,6+ Mos 11/17/2016, 10/03/2017, 08/31/2018   Influenza-Unspecified 08/29/2018, 09/16/2021   Moderna Sars-Covid-2 Vaccination 02/07/2020, 03/06/2020   Tdap 03/15/2017    Health Maintenance  Topic Date Due   Zoster Vaccines- Shingrix (1 of 2) Never done   COVID-19 Vaccine (3 - 2023-24 season) 08/05/2022   COLONOSCOPY (Pts 45-32yrs Insurance coverage will need to be confirmed)  09/04/2023 (Originally 09/04/2007)   INFLUENZA VACCINE  Completed   Hepatitis C Screening  Completed   HIV Screening  Completed   HPV VACCINES  Aged Out    Last prostate ca screening: ***  -Negative for high alcohol intake or tobacco use.  -Concerns and/or follow up today: ***  Review of Systems  Current Outpatient Medications on File Prior to Visit  Medication Sig Dispense Refill   amLODipine (NORVASC) 10 MG tablet TAKE 1 TABLET (10 MG TOTAL) BY MOUTH DAILY 90 tablet 0   atorvastatin (LIPITOR) 40 MG tablet TAKE 1 TABLET (40 MG TOTAL) BY MOUTH DAILY. 90 tablet 2   clonazePAM (KLONOPIN) 0.5 MG tablet TAKE 1/2-1 TAB BY MOUTH A COUPLE TIMES PER WEEK AS NEEDED FOR ANXIETY. 15 tablet 1   Eszopiclone 3 MG TABS TAKE 1 TABLET BY MOUTH AT BEDTIME. TAKE IMMEDIATELY BEFORE BEDTIME 04/03 30 tablet 3   levothyroxine (SYNTHROID) 88 MCG tablet Take 1 tablet (88 mcg total) by mouth daily. 90 tablet 2   lisinopril (ZESTRIL) 10 MG tablet TAKE 1 TABLET BY MOUTH BY DAILY. 90 tablet 2   propranolol (INDERAL) 20 MG tablet TAKE 1 TABLET BY MOUTH 2 TIMES DAILY. 180 tablet 2   QUEtiapine (SEROQUEL) 200 MG tablet Take 1 tablet (200 mg total) by mouth at bedtime. 30 tablet 0   venlafaxine XR (EFFEXOR-XR) 75 MG 24 hr  capsule Take 1 capsule (75 mg total) by mouth daily with breakfast. 30 capsule 0   No current facility-administered medications on file prior to visit.    Past Medical History:  Diagnosis Date   Anxiety    Carpal tunnel syndrome of right wrist    Depression    Hypothyroidism     Past Surgical History:  Procedure Laterality Date   CARPAL TUNNEL RELEASE Right 03/01/2013   Procedure: CARPAL TUNNEL RELEASE;  Surgeon: Magnus Sinning, MD;  Location: [facility];  Service: Orthopedics;  Laterality: Right;   SHOULDER ARTHROSCOPY WITH OPEN ROTATOR CUFF REPAIR AND DISTAL CLAVICLE ACROMINECTOMY  12/19/2012   Procedure: SHOULDER ARTHROSCOPY WITH OPEN ROTATOR CUFF REPAIR AND DISTAL CLAVICLE ACROMINECTOMY;  Surgeon: Magnus Sinning, MD;  Location: [facility];  Service: Orthopedics;  Laterality: Left;  LEFT SHOULDER ARTHROSCOPY WITH LABRAL DEBRIDEMENT AND SUBACROMIAL DECOMPRESSION DISTAL CLAVICLE RESECTION AND MINI OPEN ROTATOR CUFF REPAIR WITH ANCHOR    No Known Allergies  Family History  Problem Relation Age of Onset   Diabetes Mother    Mental illness Mother    Hypertension Mother    Mental illness Father        Alzheimer's   Hypertension Father    AAA (abdominal aortic aneurysm) Father    Mental illness Sister    AAA (abdominal aortic aneurysm) Paternal Uncle     Social History   Socioeconomic History   Marital status:  Single    Spouse name: Not on file   Number of children: Not on file   Years of education: Not on file   Highest education level: Not on file  Occupational History   Not on file  Tobacco Use   Smoking status: Former    Packs/day: 1.00    Years: 8.00    Total pack years: 8.00    Types: Cigarettes    Quit date: 12/05/1986    Years since quitting: 35.9   Smokeless tobacco: Never  Substance and Sexual Activity   Alcohol use: Yes    Alcohol/week: 7.0 standard drinks of alcohol    Types: 7 Glasses of wine per week   Drug use: No   Sexual activity:  Yes  Other Topics Concern   Not on file  Social History Narrative   Not on file   Social Determinants of Health   Financial Resource Strain: Not on file  Food Insecurity: Not on file  Transportation Needs: Not on file  Physical Activity: Not on file  Stress: Not on file  Social Connections: Not on file    There were no vitals filed for this visit. There is no height or weight on file to calculate BMI.  Wt Readings from Last 3 Encounters:  11/24/21 279 lb 2 oz (126.6 kg)  06/16/21 288 lb (130.6 kg)  02/12/21 289 lb 6 oz (131.3 kg)    Physical Exam  ASSESSMENT AND PLAN:  There are no diagnoses linked to this encounter. No orders of the defined types were placed in this encounter.   There are no diagnoses linked to this encounter.  No follow-ups on file.  Betty G. [place], MD  [facility]. [place] office.

## 2022-11-02 ENCOUNTER — Encounter: Payer: 59 | Admitting: Family Medicine

## 2022-11-10 ENCOUNTER — Encounter: Payer: Self-pay | Admitting: *Deleted

## 2023-05-08 ENCOUNTER — Encounter: Payer: Self-pay | Admitting: *Deleted
